# Patient Record
Sex: Male | Born: 1945 | Race: Black or African American | Hispanic: No | Smoking: Never smoker
Health system: Southern US, Community
[De-identification: ages and names within clinical notes are randomized; demographics above are authoritative.]

## PROBLEM LIST (undated history)

## (undated) ENCOUNTER — Emergency Department (HOSPITAL_COMMUNITY): Payer: Non-veteran care | Source: Home / Self Care

## (undated) DIAGNOSIS — F29 Unspecified psychosis not due to a substance or known physiological condition: Secondary | ICD-10-CM

## (undated) DIAGNOSIS — F1010 Alcohol abuse, uncomplicated: Secondary | ICD-10-CM

## (undated) DIAGNOSIS — K219 Gastro-esophageal reflux disease without esophagitis: Secondary | ICD-10-CM

## (undated) DIAGNOSIS — F0390 Unspecified dementia without behavioral disturbance: Secondary | ICD-10-CM

## (undated) DIAGNOSIS — I639 Cerebral infarction, unspecified: Secondary | ICD-10-CM

---

## 2011-03-21 ENCOUNTER — Emergency Department (HOSPITAL_COMMUNITY): Payer: Self-pay

## 2011-03-21 ENCOUNTER — Encounter (HOSPITAL_COMMUNITY): Payer: Self-pay

## 2011-03-21 ENCOUNTER — Emergency Department (HOSPITAL_COMMUNITY)
Admission: EM | Admit: 2011-03-21 | Discharge: 2011-03-21 | Disposition: A | Discharge status: Home / Self Care | Payer: Self-pay | Attending: Emergency Medicine | Admitting: Emergency Medicine

## 2011-03-21 DIAGNOSIS — M5030 Other cervical disc degeneration, unspecified cervical region: Secondary | ICD-10-CM | POA: Insufficient documentation

## 2011-03-21 DIAGNOSIS — F29 Unspecified psychosis not due to a substance or known physiological condition: Secondary | ICD-10-CM | POA: Insufficient documentation

## 2011-03-21 DIAGNOSIS — W06XXXA Fall from bed, initial encounter: Secondary | ICD-10-CM | POA: Insufficient documentation

## 2011-03-21 DIAGNOSIS — M25559 Pain in unspecified hip: Secondary | ICD-10-CM | POA: Insufficient documentation

## 2011-03-21 DIAGNOSIS — G319 Degenerative disease of nervous system, unspecified: Secondary | ICD-10-CM | POA: Insufficient documentation

## 2011-03-21 DIAGNOSIS — Y9210 Unspecified residential institution as the place of occurrence of the external cause: Secondary | ICD-10-CM | POA: Insufficient documentation

## 2011-03-21 DIAGNOSIS — T1490XA Injury, unspecified, initial encounter: Secondary | ICD-10-CM | POA: Insufficient documentation

## 2011-07-26 ENCOUNTER — Inpatient Hospital Stay (HOSPITAL_COMMUNITY)
Admission: EM | Admit: 2011-07-26 | Discharge: 2011-08-01 | DRG: 689 | Disposition: A | Discharge status: Skilled Nursing Facility | Payer: Non-veteran care | Attending: Internal Medicine | Admitting: Internal Medicine

## 2011-07-26 ENCOUNTER — Emergency Department (HOSPITAL_COMMUNITY): Payer: Non-veteran care

## 2011-07-26 DIAGNOSIS — J9819 Other pulmonary collapse: Secondary | ICD-10-CM | POA: Diagnosis present

## 2011-07-26 DIAGNOSIS — K922 Gastrointestinal hemorrhage, unspecified: Secondary | ICD-10-CM | POA: Diagnosis present

## 2011-07-26 DIAGNOSIS — J189 Pneumonia, unspecified organism: Secondary | ICD-10-CM | POA: Diagnosis present

## 2011-07-26 DIAGNOSIS — B958 Unspecified staphylococcus as the cause of diseases classified elsewhere: Secondary | ICD-10-CM | POA: Diagnosis present

## 2011-07-26 DIAGNOSIS — M5030 Other cervical disc degeneration, unspecified cervical region: Secondary | ICD-10-CM | POA: Diagnosis present

## 2011-07-26 DIAGNOSIS — Z8673 Personal history of transient ischemic attack (TIA), and cerebral infarction without residual deficits: Secondary | ICD-10-CM

## 2011-07-26 DIAGNOSIS — G92 Toxic encephalopathy: Secondary | ICD-10-CM | POA: Diagnosis present

## 2011-07-26 DIAGNOSIS — R1310 Dysphagia, unspecified: Secondary | ICD-10-CM | POA: Diagnosis present

## 2011-07-26 DIAGNOSIS — N390 Urinary tract infection, site not specified: Principal | ICD-10-CM | POA: Diagnosis present

## 2011-07-26 DIAGNOSIS — E875 Hyperkalemia: Secondary | ICD-10-CM | POA: Diagnosis present

## 2011-07-26 DIAGNOSIS — D649 Anemia, unspecified: Secondary | ICD-10-CM | POA: Diagnosis present

## 2011-07-26 DIAGNOSIS — G929 Unspecified toxic encephalopathy: Secondary | ICD-10-CM | POA: Diagnosis present

## 2011-07-26 DIAGNOSIS — F17200 Nicotine dependence, unspecified, uncomplicated: Secondary | ICD-10-CM | POA: Diagnosis present

## 2011-07-26 DIAGNOSIS — R4182 Altered mental status, unspecified: Secondary | ICD-10-CM | POA: Diagnosis present

## 2011-07-26 HISTORY — DX: Unspecified psychosis not due to a substance or known physiological condition: F29

## 2011-07-26 HISTORY — DX: Gastro-esophageal reflux disease without esophagitis: K21.9

## 2011-07-26 HISTORY — DX: Alcohol abuse, uncomplicated: F10.10

## 2011-07-26 HISTORY — DX: Cerebral infarction, unspecified: I63.9

## 2011-07-26 LAB — AMMONIA: Ammonia: 26 umol/L (ref 11–60)

## 2011-07-26 LAB — URINALYSIS, ROUTINE W REFLEX MICROSCOPIC
Bilirubin Urine: NEGATIVE
Nitrite: POSITIVE — AB
Protein, ur: NEGATIVE mg/dL
Specific Gravity, Urine: 1.026 (ref 1.005–1.030)
Urobilinogen, UA: 0.2 mg/dL (ref 0.0–1.0)

## 2011-07-26 LAB — URINE MICROSCOPIC-ADD ON

## 2011-07-26 LAB — CBC
HCT: 34.9 % — ABNORMAL LOW (ref 39.0–52.0)
Hemoglobin: 12.2 g/dL — ABNORMAL LOW (ref 13.0–17.0)
MCH: 31.6 pg (ref 26.0–34.0)
MCHC: 35 g/dL (ref 30.0–36.0)

## 2011-07-26 LAB — DIFFERENTIAL
Lymphocytes Relative: 32 % (ref 12–46)
Lymphs Abs: 2 10*3/uL (ref 0.7–4.0)
Monocytes Absolute: 0.5 10*3/uL (ref 0.1–1.0)
Monocytes Relative: 9 % (ref 3–12)
Neutro Abs: 3.1 10*3/uL (ref 1.7–7.7)

## 2011-07-26 LAB — COMPREHENSIVE METABOLIC PANEL
Alkaline Phosphatase: 188 U/L — ABNORMAL HIGH (ref 39–117)
BUN: 24 mg/dL — ABNORMAL HIGH (ref 6–23)
CO2: 29 mEq/L (ref 19–32)
Calcium: 10.4 mg/dL (ref 8.4–10.5)
GFR calc Af Amer: >60 mL/min (ref >60)
GFR calc non Af Amer: >60 mL/min (ref >60)
Glucose, Bld: 73 mg/dL (ref 70–99)
Total Protein: 7.9 g/dL (ref 6.0–8.3)

## 2011-07-27 ENCOUNTER — Inpatient Hospital Stay (HOSPITAL_COMMUNITY): Payer: Non-veteran care

## 2011-07-27 ENCOUNTER — Encounter (HOSPITAL_COMMUNITY): Payer: Self-pay | Admitting: Radiology

## 2011-07-27 LAB — PROTIME-INR: INR: 1.09 (ref 0.00–1.49)

## 2011-07-27 LAB — CBC
HCT: 31.6 % — ABNORMAL LOW (ref 39.0–52.0)
Hemoglobin: 10.8 g/dL — ABNORMAL LOW (ref 13.0–17.0)
MCV: 91.3 fL (ref 78.0–100.0)
RDW: 13.4 % (ref 11.5–15.5)
WBC: 5.5 10*3/uL (ref 4.0–10.5)

## 2011-07-27 LAB — FERRITIN: Ferritin: 122 ng/mL (ref 22–322)

## 2011-07-27 LAB — BASIC METABOLIC PANEL
CO2: 31 mEq/L (ref 19–32)
Chloride: 103 mEq/L (ref 96–112)
GFR calc Af Amer: >60 mL/min (ref >60)
Potassium: 4.1 mEq/L (ref 3.5–5.1)
Sodium: 140 mEq/L (ref 135–145)

## 2011-07-27 LAB — APTT: aPTT: 30 seconds (ref 24–37)

## 2011-07-27 LAB — GLUCOSE, CAPILLARY
Glucose-Capillary: 106 mg/dL — ABNORMAL HIGH (ref 70–99)
Glucose-Capillary: 97 mg/dL (ref 70–99)
Glucose-Capillary: 102 mg/dL — ABNORMAL HIGH (ref 70–99)

## 2011-07-27 LAB — IRON AND TIBC
Iron: 104 ug/dL (ref 42–135)
Saturation Ratios: 38 % (ref 20–55)
TIBC: 271 ug/dL (ref 215–435)
UIBC: 167 ug/dL

## 2011-07-27 MED ORDER — IOHEXOL 300 MG/ML  SOLN
50.0000 mL | Freq: Once | INTRAMUSCULAR | Status: AC | PRN
  Administered 2011-07-27: 15 mL via INTRAVENOUS

## 2011-07-28 ENCOUNTER — Inpatient Hospital Stay (HOSPITAL_COMMUNITY): Payer: Non-veteran care

## 2011-07-28 DIAGNOSIS — F29 Unspecified psychosis not due to a substance or known physiological condition: Secondary | ICD-10-CM

## 2011-07-28 LAB — BASIC METABOLIC PANEL
BUN: 8 mg/dL (ref 6–23)
CO2: 28 mEq/L (ref 19–32)
Calcium: 9.6 mg/dL (ref 8.4–10.5)
Creatinine, Ser: 0.47 mg/dL — ABNORMAL LOW (ref 0.50–1.35)
Creatinine, Ser: <0.47 mg/dL — ABNORMAL LOW (ref 0.50–1.35)
GFR calc Af Amer: >60 mL/min (ref >60)
GFR calc non Af Amer: >60 mL/min (ref >60)
Glucose, Bld: 95 mg/dL (ref 70–99)
Sodium: 139 mEq/L (ref 135–145)

## 2011-07-28 LAB — CBC
HCT: 31.1 % — ABNORMAL LOW (ref 39.0–52.0)
Hemoglobin: 11.1 g/dL — ABNORMAL LOW (ref 13.0–17.0)
MCH: 31.5 pg (ref 26.0–34.0)
MCH: 32.1 pg (ref 26.0–34.0)
MCHC: 35.1 g/dL (ref 30.0–36.0)
MCHC: 35.7 g/dL (ref 30.0–36.0)
MCV: 89.6 fL (ref 78.0–100.0)
MCV: 89.9 fL (ref 78.0–100.0)
Platelets: 288 10*3/uL (ref 150–400)
RBC: 3.56 MIL/uL — ABNORMAL LOW (ref 4.22–5.81)
RDW: 13 % (ref 11.5–15.5)
RDW: 13.2 % (ref 11.5–15.5)

## 2011-07-28 LAB — GLUCOSE, CAPILLARY
Glucose-Capillary: 90 mg/dL (ref 70–99)
Glucose-Capillary: 100 mg/dL — ABNORMAL HIGH (ref 70–99)
Glucose-Capillary: 94 mg/dL (ref 70–99)

## 2011-07-28 LAB — URINE CULTURE: Culture  Setup Time: 201208012055

## 2011-07-29 LAB — VANCOMYCIN, TROUGH: Vancomycin Tr: 19.5 ug/mL (ref 10.0–20.0)

## 2011-07-30 ENCOUNTER — Inpatient Hospital Stay (HOSPITAL_COMMUNITY): Payer: Non-veteran care

## 2011-07-30 DIAGNOSIS — F29 Unspecified psychosis not due to a substance or known physiological condition: Secondary | ICD-10-CM

## 2011-07-30 LAB — CBC
HCT: 27.7 % — ABNORMAL LOW (ref 39.0–52.0)
MCH: 31.7 pg (ref 26.0–34.0)
MCV: 88.5 fL (ref 78.0–100.0)
MCV: 87.8 fL (ref 78.0–100.0)
Platelets: 264 10*3/uL (ref 150–400)
Platelets: 275 10*3/uL (ref 150–400)
RBC: 3.13 MIL/uL — ABNORMAL LOW (ref 4.22–5.81)
RDW: 13.1 % (ref 11.5–15.5)
RDW: 13 % (ref 11.5–15.5)
WBC: 6.7 10*3/uL (ref 4.0–10.5)

## 2011-07-31 LAB — CBC
HCT: 28.2 % — ABNORMAL LOW (ref 39.0–52.0)
Hemoglobin: 10.1 g/dL — ABNORMAL LOW (ref 13.0–17.0)
Hemoglobin: 9.7 g/dL — ABNORMAL LOW (ref 13.0–17.0)
MCH: 31.1 pg (ref 26.0–34.0)
MCH: 31.8 pg (ref 26.0–34.0)
MCHC: 36.2 g/dL — ABNORMAL HIGH (ref 30.0–36.0)
MCHC: 35.3 g/dL (ref 30.0–36.0)
MCHC: 36.2 g/dL — ABNORMAL HIGH (ref 30.0–36.0)
MCHC: 36.3 g/dL — ABNORMAL HIGH (ref 30.0–36.0)
MCV: 88 fL (ref 78.0–100.0)
MCV: 87.6 fL (ref 78.0–100.0)
Platelets: 281 10*3/uL (ref 150–400)
Platelets: 285 10*3/uL (ref 150–400)
Platelets: 274 10*3/uL (ref 150–400)
RDW: 13.1 % (ref 11.5–15.5)
RDW: 13.2 % (ref 11.5–15.5)
RDW: 13.2 % (ref 11.5–15.5)
WBC: 5.1 10*3/uL (ref 4.0–10.5)
WBC: 5.6 10*3/uL (ref 4.0–10.5)

## 2011-08-01 LAB — CROSSMATCH
Antibody Screen: NEGATIVE
Unit division: 0

## 2011-08-01 LAB — CBC
HCT: 28.1 % — ABNORMAL LOW (ref 39.0–52.0)
Hemoglobin: 10 g/dL — ABNORMAL LOW (ref 13.0–17.0)
MCHC: 35.6 g/dL (ref 30.0–36.0)
MCV: 87.5 fL (ref 78.0–100.0)
Platelets: 264 10*3/uL (ref 150–400)
RBC: 3.03 MIL/uL — ABNORMAL LOW (ref 4.22–5.81)
RBC: 3.19 MIL/uL — ABNORMAL LOW (ref 4.22–5.81)
RDW: 13.4 % (ref 11.5–15.5)
WBC: 5.6 10*3/uL (ref 4.0–10.5)
WBC: 5.9 10*3/uL (ref 4.0–10.5)

## 2011-08-03 LAB — CULTURE, BLOOD (SINGLE): Culture  Setup Time: 201208032146

## 2011-08-04 NOTE — Consult Note (Signed)
[name] [name], SHEAHAN NO.:  [id number]  MEDICAL RECORD NO.:  [id number]  LOCATION:  4503                         FACILITY:  [facility]  PHYSICIAN:  Willis Modena, MD     DATE OF BIRTH:  04-22-1946  DATE OF CONSULTATION:  07/31/2011 DATE OF DISCHARGE:                                CONSULTATION   REASON FOR CONSULTATION:  Blood in stool.  CHIEF COMPLAINT:  Blood in stool.  HISTORY OF PRESENT ILLNESS:  [name] [name] is a 65 year old gentleman who lives at a nursing home.  He was admitted several days ago for altered mental status.  Per chart, he has a history of polysubstance abuse.  He ultimately had a PEG tube replaced a couple of days ago by Interventional Radiology.  I was called to see [name] [name] because yesterday, he had 2 episodes of bright red blood per rectum.  [name] [name] is still not able to carry on an intelligible conversation.  As a result, no further history is able to be obtained.  He does not have any appreciable abdominal pain and the nurses have not reported any further bleeding for the past 12 hours.  There is also no reported bleeding through his PEG tube.  Past medical history, past surgical history, home medications, allergies, family history, social history, and review of systems, all from dictated note from Dr. Sharon Seller, dated July 26, 2011.  I reviewed and I agree.  PHYSICAL EXAMINATION:  VITAL SIGNS:  Blood pressure 102/57, heart rate 67, respiratory rate 20, temperature 97.1, oxygen saturation 97% on room air. GENERAL:  [name] [name] is awake, a bit combative, able to speak, but is not able to carry on any appreciable conversation. HEENT:  Poor dentition.  No oropharyngeal lesions.  Eyes, sclerae anicteric.  Conjunctivae pink. NECK:  Supple. LUNGS:  Clear. HEART:  Regular. ABDOMEN:  Soft.  PEG site looks clean, dry, and intact.  No blood or discharge from this area.  Normoactive bowel sounds.  No liver or splenic enlargement.  No bulging  flanks to suggest ascites. EXTREMITIES:  No peripheral cyanosis, clubbing, or edema. NEUROLOGIC:  As I mentioned, he is combative, cannot really answer questions intelligibly or appropriately. LYMPHATICS:  No papillary, axillary, submandibular, or supraclavicular adenopathy. SKIN:  No obvious rash or ecchymoses. RECTAL:  Showed normal anal sphincter tone, brown stool.  No evidence of blood.  LABORATORY STUDIES:  Hemoglobin on his arrival is 12.2, currently 10.0, his hemoglobin has remained about the same for the past 2 days.  RADIOLOGIC STUDIES:  As I mentioned, he had a replacement of his gastrostomy tube 2 days ago.  He also had a Speech Therapy consultation which recommended that he not consume any food products by mouth.  IMPRESSION:  [name] [name] is a 65 year old gentleman, admitted with altered mental status.  He had a PEG replacement a couple of days ago.  He is not able to provide any appreciable history, but by nursing reports, he had couple episodes of bright red blood per rectum yesterday.  He has not had any further bleeding today.  Source of this bleeding is unclear, but sounds most consistent with a hemorrhoidal or other anorectal process.  Certainly, multiple other differential considerations cannot be completely excluded.  He is hemodynamically stable.  PLAN: 1. Agree with supportive management with feeds through this PEG tube     as well as serial CBCs. 2. Typically one would recommend a colonoscopy or at least a     sigmoidoscopy in this situation.  However, given the patient's     altered mentation as well as his exquisitely poor functional status     at this point, I would defer any endoscopic type of evaluation     unless or until he has further ongoing bleeding.  Certainly, we     need to talk to his healthcare power of attorney before pursuing     those avenues.  Hopefully, we will not come to that.  Thanks again for allowing me to participate in [name] [name]  care.     Willis Modena, MD     WO/MEDQ  D:  07/31/2011  T:  08/01/2011  Job:  [id number]  Electronically Signed by Willis Modena  on 08/04/2011 82:95:62 PM

## 2011-09-13 ENCOUNTER — Ambulatory Visit (HOSPITAL_COMMUNITY)
Admission: RE | Admit: 2011-09-13 | Discharge: 2011-09-13 | Disposition: A | Discharge status: Home / Self Care | Payer: Non-veteran care | Source: Ambulatory Visit | Attending: Internal Medicine | Admitting: Internal Medicine

## 2011-09-13 ENCOUNTER — Other Ambulatory Visit (HOSPITAL_BASED_OUTPATIENT_CLINIC_OR_DEPARTMENT_OTHER): Payer: Self-pay | Admitting: Internal Medicine

## 2011-09-13 DIAGNOSIS — Z431 Encounter for attention to gastrostomy: Secondary | ICD-10-CM | POA: Insufficient documentation

## 2011-09-13 DIAGNOSIS — F0390 Unspecified dementia without behavioral disturbance: Secondary | ICD-10-CM | POA: Insufficient documentation

## 2011-09-13 MED ORDER — IOHEXOL 300 MG/ML  SOLN
50.0000 mL | Freq: Once | INTRAMUSCULAR | Status: AC | PRN
  Administered 2011-09-13: 10 mL via INTRAVENOUS
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
## 2011-09-21 ENCOUNTER — Emergency Department (HOSPITAL_COMMUNITY): Payer: Non-veteran care

## 2011-09-21 ENCOUNTER — Observation Stay (HOSPITAL_COMMUNITY)
Admission: EM | Admit: 2011-09-21 | Discharge: 2011-09-25 | Disposition: A | Discharge status: Skilled Nursing Facility | Payer: Non-veteran care | Attending: Internal Medicine | Admitting: Internal Medicine

## 2011-09-21 DIAGNOSIS — F29 Unspecified psychosis not due to a substance or known physiological condition: Secondary | ICD-10-CM | POA: Insufficient documentation

## 2011-09-21 DIAGNOSIS — Z86718 Personal history of other venous thrombosis and embolism: Secondary | ICD-10-CM | POA: Insufficient documentation

## 2011-09-21 DIAGNOSIS — Z79899 Other long term (current) drug therapy: Secondary | ICD-10-CM | POA: Insufficient documentation

## 2011-09-21 DIAGNOSIS — R1319 Other dysphagia: Secondary | ICD-10-CM | POA: Insufficient documentation

## 2011-09-21 DIAGNOSIS — G40909 Epilepsy, unspecified, not intractable, without status epilepticus: Secondary | ICD-10-CM | POA: Insufficient documentation

## 2011-09-21 DIAGNOSIS — M7989 Other specified soft tissue disorders: Secondary | ICD-10-CM

## 2011-09-21 DIAGNOSIS — Z7982 Long term (current) use of aspirin: Secondary | ICD-10-CM | POA: Insufficient documentation

## 2011-09-21 DIAGNOSIS — R599 Enlarged lymph nodes, unspecified: Secondary | ICD-10-CM | POA: Insufficient documentation

## 2011-09-21 DIAGNOSIS — Z8673 Personal history of transient ischemic attack (TIA), and cerebral infarction without residual deficits: Secondary | ICD-10-CM | POA: Insufficient documentation

## 2011-09-21 DIAGNOSIS — R079 Chest pain, unspecified: Principal | ICD-10-CM | POA: Insufficient documentation

## 2011-09-21 LAB — DIFFERENTIAL
Basophils Relative: 0 % (ref 0–1)
Lymphocytes Relative: 23 % (ref 12–46)
Monocytes Absolute: 0.6 10*3/uL (ref 0.1–1.0)
Monocytes Relative: 7 % (ref 3–12)
Neutro Abs: 6.2 10*3/uL (ref 1.7–7.7)
Neutrophils Relative %: 66 % (ref 43–77)

## 2011-09-21 LAB — URINALYSIS, ROUTINE W REFLEX MICROSCOPIC
Glucose, UA: NEGATIVE mg/dL
Leukocytes, UA: NEGATIVE
Nitrite: NEGATIVE
Specific Gravity, Urine: 1.023 (ref 1.005–1.030)
pH: 6 (ref 5.0–8.0)

## 2011-09-21 LAB — CK TOTAL AND CKMB (NOT AT ARMC)
CK, MB: 2.6 ng/mL (ref 0.3–4.0)
Relative Index: INVALID (ref 0.0–2.5)
Total CK: 79 U/L (ref 7–232)

## 2011-09-21 LAB — PROTIME-INR: INR: 1 (ref 0.00–1.49)

## 2011-09-21 LAB — CBC
HCT: 30.9 % — ABNORMAL LOW (ref 39.0–52.0)
Hemoglobin: 10.8 g/dL — ABNORMAL LOW (ref 13.0–17.0)
MCH: 31.7 pg (ref 26.0–34.0)
MCHC: 35 g/dL (ref 30.0–36.0)
RBC: 3.41 MIL/uL — ABNORMAL LOW (ref 4.22–5.81)

## 2011-09-21 LAB — POCT I-STAT TROPONIN I

## 2011-09-21 LAB — LIPID PANEL: Cholesterol: 134 mg/dL (ref 0–200)

## 2011-09-21 LAB — POCT I-STAT, CHEM 8
BUN: 28 mg/dL — ABNORMAL HIGH (ref 6–23)
Creatinine, Ser: 1 mg/dL (ref 0.50–1.35)
Glucose, Bld: 94 mg/dL (ref 70–99)
Potassium: 4.1 mEq/L (ref 3.5–5.1)
Sodium: 137 mEq/L (ref 135–145)
TCO2: 27 mmol/L (ref 0–100)

## 2011-09-21 LAB — APTT: aPTT: 30 seconds (ref 24–37)

## 2011-09-21 LAB — TROPONIN I: Troponin I: <0.3 ng/mL (ref <0.30)

## 2011-09-21 LAB — D-DIMER, QUANTITATIVE: D-Dimer, Quant: 1.25 ug/mL-FEU — ABNORMAL HIGH (ref 0.00–0.48)

## 2011-09-21 MED ORDER — IOHEXOL 350 MG/ML SOLN
80.0000 mL | Freq: Once | INTRAVENOUS | Status: AC | PRN
  Administered 2011-09-21: 80 mL via INTRAVENOUS
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
## 2011-09-22 DIAGNOSIS — I517 Cardiomegaly: Secondary | ICD-10-CM

## 2011-09-23 LAB — CBC
MCH: 32 pg (ref 26.0–34.0)
Platelets: 257 10*3/uL (ref 150–400)
RBC: 3.53 MIL/uL — ABNORMAL LOW (ref 4.22–5.81)
WBC: 6.3 10*3/uL (ref 4.0–10.5)

## 2011-09-23 LAB — GLUCOSE, CAPILLARY: Glucose-Capillary: 142 mg/dL — ABNORMAL HIGH (ref 70–99)

## 2011-09-23 LAB — BASIC METABOLIC PANEL
CO2: 31 mEq/L (ref 19–32)
Calcium: 9.8 mg/dL (ref 8.4–10.5)
Creatinine, Ser: 0.53 mg/dL (ref 0.50–1.35)
GFR calc Af Amer: >60 mL/min (ref >60)

## 2011-09-23 LAB — URINE CULTURE: Colony Count: 30000

## 2011-09-24 LAB — GLUCOSE, CAPILLARY: Glucose-Capillary: 159 mg/dL — ABNORMAL HIGH (ref 70–99)

## 2011-09-25 NOTE — H&P (Signed)
[name] [name], HEETER NO.:  [id number]  MEDICAL RECORD NO.:  [id number]  LOCATION:  [facility]                         FACILITY:  [facility]  PHYSICIAN:  Osvaldo Shipper, MD     DATE OF BIRTH:  [birth year]  DATE OF ADMISSION:  09/21/2011 DATE OF DISCHARGE:                             HISTORY & PHYSICAL   PRIMARY CARE PHYSICIAN:  Medical laboratory scientific officer.  The patient resides in [facility] in the nursing facility.  ADMISSION DIAGNOSES: 1. Chest pain, etiology unclear. 2. History of seizure disorder. 3. History of stroke with confusion at baseline. 4. History of psychosis. 5. Dysphagia, requiring PEG feeds. 6. Venous thromboembolism in the past.  CHIEF COMPLAINT:  Chest pain.  HISTORY OF PRESENT ILLNESS:  The patient is a 65 year old African American male with past medical history of stroke, psychosis, seizure disorder who resides in a nursing facility.  According to reports obtained from the nursing home, the patient was in his usual state of health till about 3 o'clock in the morning when he started complaining of chest pain.  Apparently this was after he was given his bolus feeding.  They checked his blood pressure and it was borderline low at 99 systolic.  So, they decided to send him to the hospital.  The patient has history of psychosis.  He has history of stroke, has encephalomalacia and possibly has cognitive impairment as a result of the same and so history is very limited.  He tells me the pain was 10/10 in intensity but currently the pain has eased up but then after a while he says he has got light pain.  He says he felt short of breath.  He had some nausea without any emesis.  Denies any palpitations.  There is no history of cough, fever, or chills.  The pain did not radiate anywhere. There were no precipitating, aggravating or relieving factors.  Once again because of his alteration in mental status, history is  very limited.  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  MEDICATIONS:  Medications at the nursing facility includes the following: 1. Folic acid 1 mg daily. 2. Prevacid 30 mg daily. 3. Centrum liquid 5 mL every morning. 4. Thiamine 100 mg daily. 5. Aspirin 81 mg daily. 6. Keppra 500 mg q.12 h. 7. Metoprolol 25 mg b.i.d. 8. Seroquel 200 mg twice daily. 9. Ativan 1 mg every 6 hours as needed for agitation orally. 10.Tylenol 650 mg every 6 hours as needed. 11.Ativan 1 mg intramuscularly every 6 hours as needed for seizure     activity. 12.Ativan gel 1 mg topically to wrist every 6 hours as needed for     agitation. 13.Magnesium oxide 400 mg every 6 hours. 14.Resource 2.0 bolus feeding 8 ounces 6 times daily. 15.Water flushes 100 mL before and after feeds and medications.  ALLERGIES:  There is allergy listed to MONOPRIL and PRAZOSIN.  The type of reaction is unknown.  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  Once again history is limited.  Based on the nursing home records there appears to be a history of venous thromboembolism in the past and it appears that he was on anticoagulation in the past.  SOCIAL HISTORY:  Lives in a nursing home.  Rest of the social history is unobtainable at this time.  FAMILY HISTORY:  Unobtainable because of the patient's encephalopathy.  REVIEW OF SYSTEMS:  Unable to do because of the patient's mental status.  PHYSICAL EXAMINATION:  VITAL SIGNS:  Temperature 97.7, blood pressure 129/79, heart rate 83, respiratory rate is 17, saturation 100% on room air. GENERAL:  He is a thin African American male in no distress. HEENT:  Head is normocephalic, atraumatic.  Pupils are equal reacting. No pallor.  No icterus.  Oral mucous membrane  is slightly dry.  Dental caries is noted. NECK:  Soft and supple.  No thyromegaly appreciated.  No cervical, supraclavicular, inguinal lymphadenopathy present. LUNGS:  Clear to auscultation bilaterally with no wheezing, rales or rhonchi. CARDIOVASCULAR:  S1, S2 is normal regular.  No S3, S4, rubs, murmurs or bruits.  No pedal edema. ABDOMEN:  Soft, nontender, nondistended.  Bowel sounds are present.  No mass or organomegaly appreciated.  PEG tube appears to be in good position.  No erythema is noted around the site. GU: External examination was unremarkable. NEUROLOGIC:  He is alert, oriented to himself but otherwise confused, which is his baseline.  Neurologically, he is able to move all his extremities but does not seem to have adequate strength. SKIN:  Does not reveal any rashes.  LABORATORY DATA:  His white cell count is normal, hemoglobin is 10.8, MCV is 90, platelet count is 256.  Coags are normal.  Electrolytes are normal.  BUN is 28, creatinine is 1.0, troponin 0.01.  Urine did not show any infection.  IMAGING STUDIES:  His chest x-ray which revealed increased density of the left hemithorax, may reflect positioning or possibly mild atelectasis, otherwise grossly clear.  EKG was done which shows sinus rhythm with normal axis.  Intervals appear to be in the normal range.  Rate is 78.  No Q-waves.  No concerning ST or T-wave changes.  There is some T flattening in the lateral leads.  No older EKG available for comparison.  QT corrected is 497.  Apparently his guardianship belongs to [facility].  ASSESSMENT:  This is a 65 year old African American male sent from a nursing home floor, chest pain.  Etiology of this is unclear. Considering his history of her venous thromboembolism that needs to be in the differential.  Acute coronary syndrome is in the differential, although less likely.  GERD, causes such symptoms as well. 1. Chest pain.  We  will go ahead and check a D-dimer to begin with.     If that is elevated.  He will need to have a CT scan of his chest.     We will also in the meantime, rule him out for acute coronary     syndrome.  EKG will be repeated.  Aspirin will be provided.  Lipid     panel will be checked. 2. History of seizure disorder.  Continue with Keppra. 3. History of anemia.  His hemoglobin is stable.  Anemia panel was     checked back in August and no iron deficiency was seen. 4. History of encephalopathy and psychosis.  We will use Ativan as     needed. 5. DVT prophylaxis will be initiated.  6. Further management decisions will depend on results of further     testing and patient's response to treatment.     Osvaldo Shipper, MD     GK/MEDQ  D:  09/21/2011  T:  09/21/2011  Job:  [id number]  cc:   [facility]  Electronically Signed by Osvaldo Shipper MD on 09/25/2011 04:54:09 PM

## 2011-09-27 NOTE — Discharge Summary (Signed)
  [name], [name] NO.:  [id number]  MEDICAL RECORD NO.:  [id number]  LOCATION:  4736                         FACILITY:  [facility]  PHYSICIAN:  Thad Ranger, MD       DATE OF BIRTH:  15-Jan-1946  DATE OF ADMISSION:  09/21/2011 DATE OF DISCHARGE:                              DISCHARGE SUMMARY   ADDENDUM  PRIMARY CARE PHYSICIAN:  Medical laboratory scientific officer.  FINAL DISCHARGE DIAGNOSES: 1. Atypical chest pain, acute coronary syndrome ruled out, resolved. 2. Seizure disorder. 3. History of cerebrovascular accident with confusion. 4. History of psychosis. 5. History of venous embolism. 6. History of dysphagia requiring PEG tube.  FINAL DISCHARGE MEDICATIONS: 1. Osmolyte 237 mL via tube 5 times daily. 2. Aspirin 81 mg via tube daily. 3. Ativan 1 mL topically every 6 hours as needed to wrist for     agitation. 4. Ativan 0.5 mL in tube every 6 hours as needed for agitation. 5. Ativan 0.5 mL intramuscularly every 6 hours as needed for seizures. 6. Centrum liquid antioxidant 5 mL in tube every morning. 7. Folic acid 1 tablet via tube daily. 8. Keppra oral solution 100 mg/mL, 5 mL via tube twice daily. 9. Lansoprazole 30 mg via tube daily. 10.Mag ox 400 mg via tube every 6 hours. 11.Metoprolol 25 mg via tube twice daily. 12.Seroquel 200 mg via tube twice daily. 13.Thiamine 100 mg via tube daily. 14.Tylenol 2 tablets via tube every 6 hours as needed for pain.  Please note that the previously dictated discharge summary by Dr. Mliss Sax on September 23, 2011, still stands.  The patient was awaiting skilled nursing facility placement over the weekend.  DISCHARGE FOLLOWUP:  The patient will follow up with his primary care physician at [facility] within next 7 days for hospital followup.  PHYSICAL EXAMINATION:  VITAL SIGNS:  At the time of the dictation, temperature 98.5, pulse 65, respirations 18, blood pressure 117/73, O2 sats 96% on room air. GENERAL:   The patient is awake, not in any acute distress. HEENT:  Anicteric sclerae and conjunctivae. CVS:  S1 and S2.  Clear.  Regular rate and rhythm. CHEST:  Clear to auscultation bilaterally.  No wheezing, rales, or rhonchi. ABDOMEN:  Soft, nontender, normal bowel sounds.  PEG tube present. EXTREMITIES:  No edema. NEURO:  Nonfocal.  Please refer to the previously dictated discharge summary by Dr. Aldine Contes for the hospital course and imaging and details.  DISCHARGE TIME:  35 minutes.     Thad Ranger, MD     RR/MEDQ  D:  09/25/2011  T:  09/25/2011  Job:  [id number]  cc:   [facility]  Electronically Signed by Andres Labrum RAI  on 09/27/2011 03:15:51 PM

## 2011-09-30 ENCOUNTER — Emergency Department (HOSPITAL_COMMUNITY): Payer: Non-veteran care

## 2011-09-30 ENCOUNTER — Emergency Department (HOSPITAL_COMMUNITY)
Admission: EM | Admit: 2011-09-30 | Discharge: 2011-09-30 | Disposition: A | Discharge status: Home / Self Care | Payer: Non-veteran care | Attending: Emergency Medicine | Admitting: Emergency Medicine

## 2011-09-30 DIAGNOSIS — Z8679 Personal history of other diseases of the circulatory system: Secondary | ICD-10-CM | POA: Insufficient documentation

## 2011-09-30 DIAGNOSIS — K9429 Other complications of gastrostomy: Secondary | ICD-10-CM | POA: Insufficient documentation

## 2011-09-30 DIAGNOSIS — F0390 Unspecified dementia without behavioral disturbance: Secondary | ICD-10-CM | POA: Insufficient documentation

## 2011-09-30 DIAGNOSIS — K219 Gastro-esophageal reflux disease without esophagitis: Secondary | ICD-10-CM | POA: Insufficient documentation

## 2011-12-10 IMAGING — XA IR REPLACE G-TUBE/COLONIC TUBE
1 series · 7 of 7 positions shown · non-contrast
Comparison: 07/30/2011

CLINICAL DATA: Pulled out gastrostomy catheter, dementia

GASTROSTOMY CATHETER REPLACEMENT

[Series 1: run · 7 of 7 slices shown]
[im 1/7]
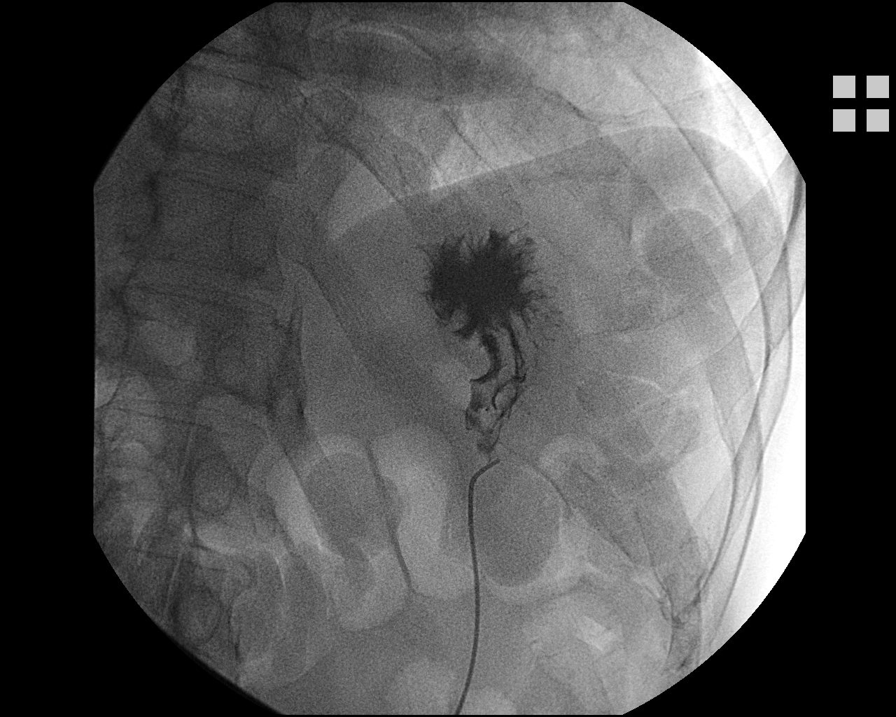
[im 2/7]
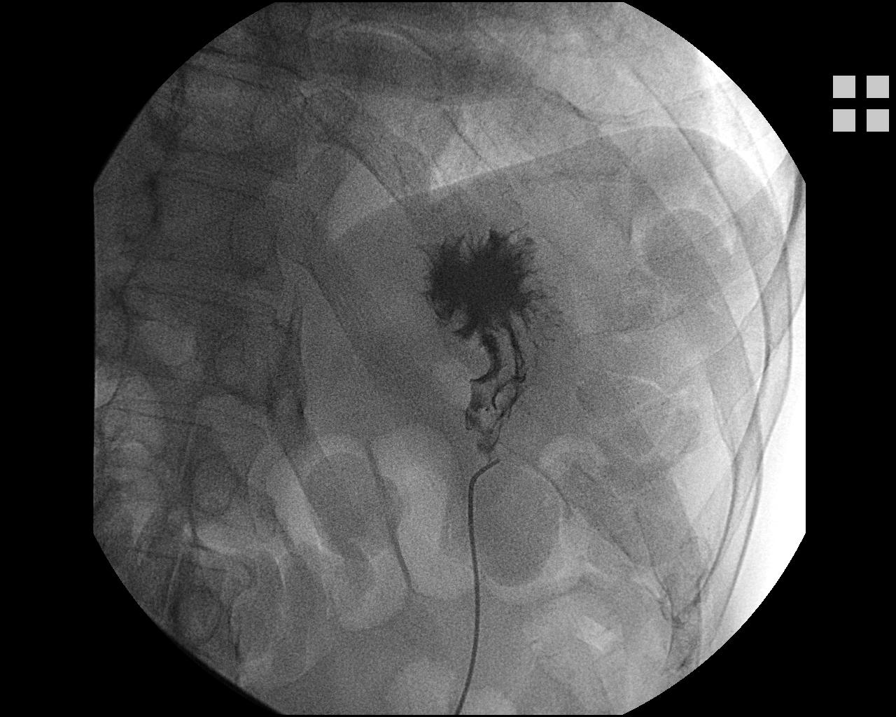
[im 3/7]
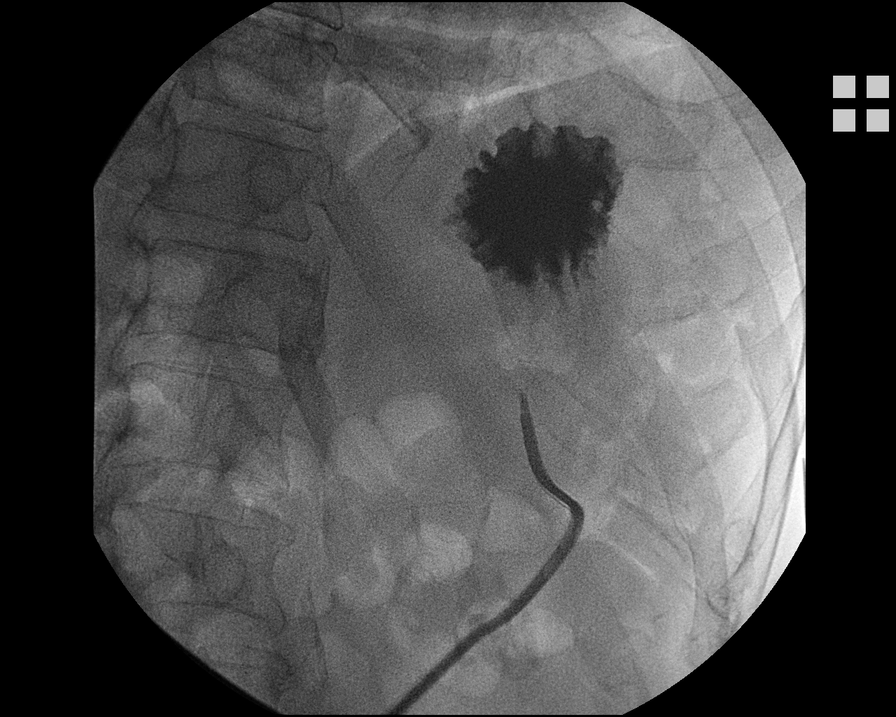
[im 4/7]
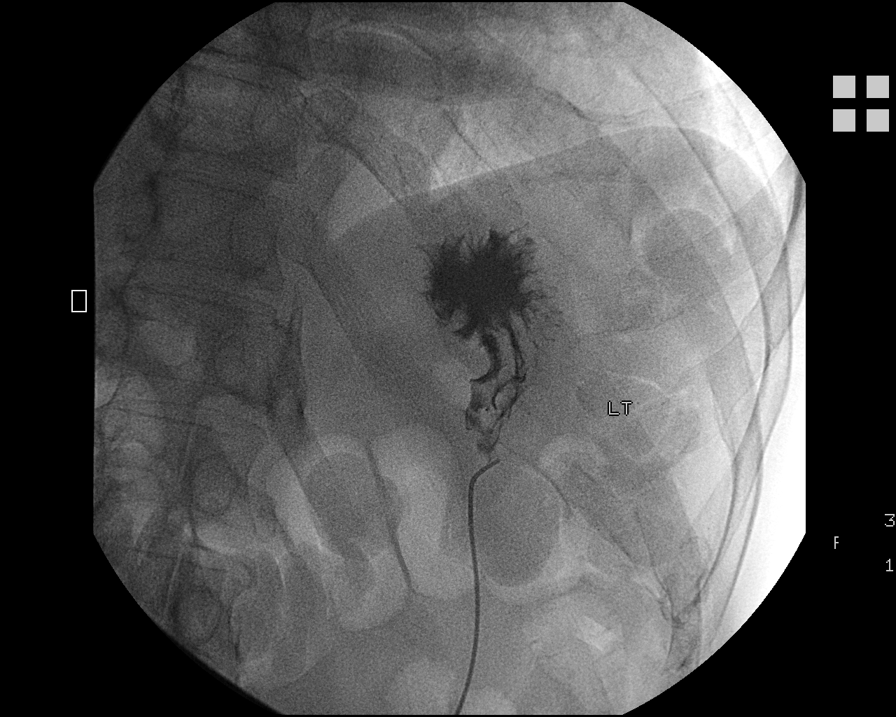
[im 5/7]
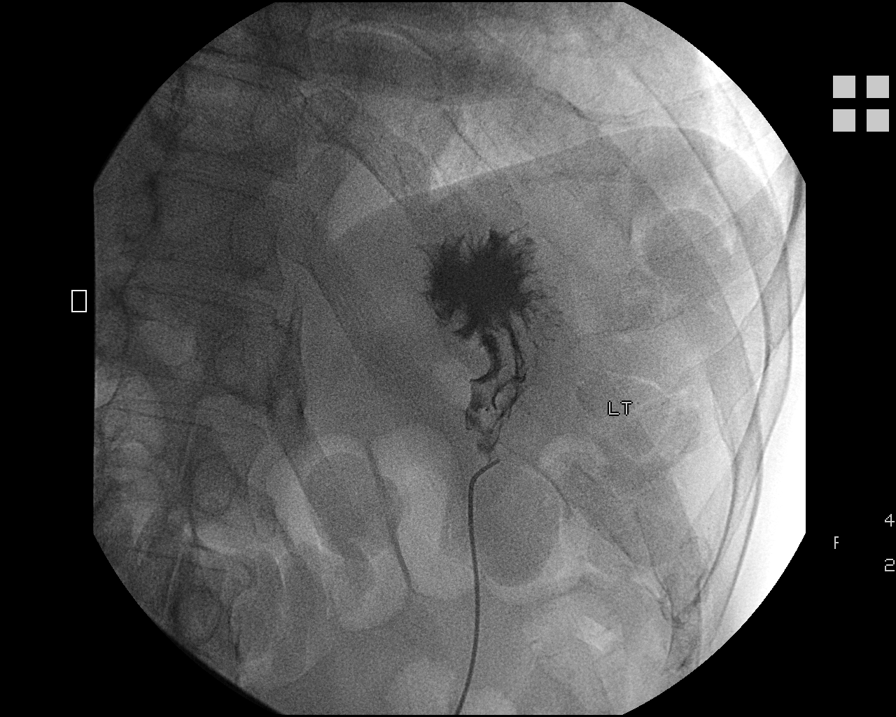
[im 6/7]
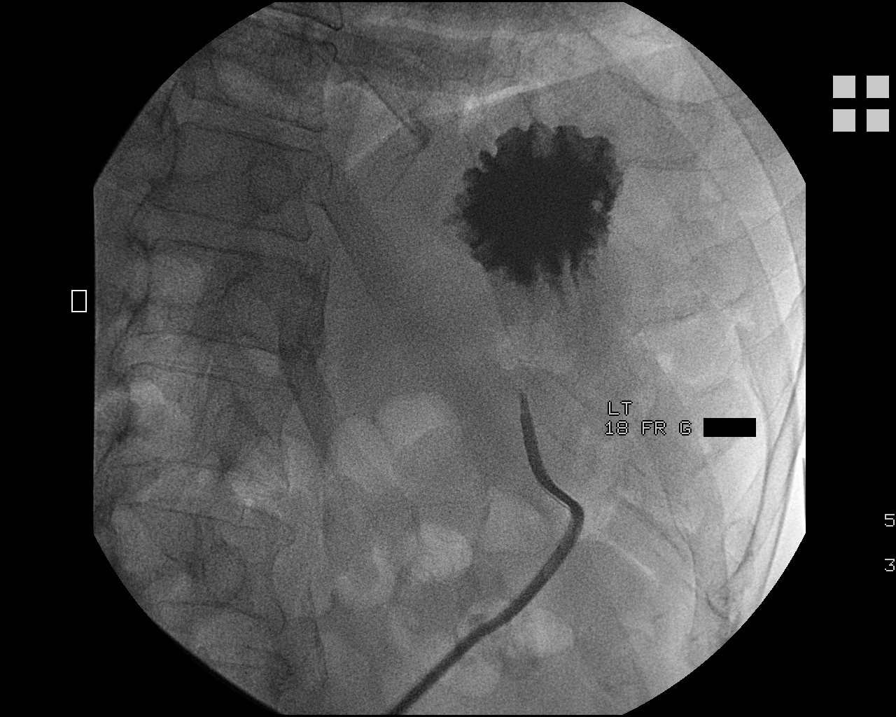
[im 7/7]
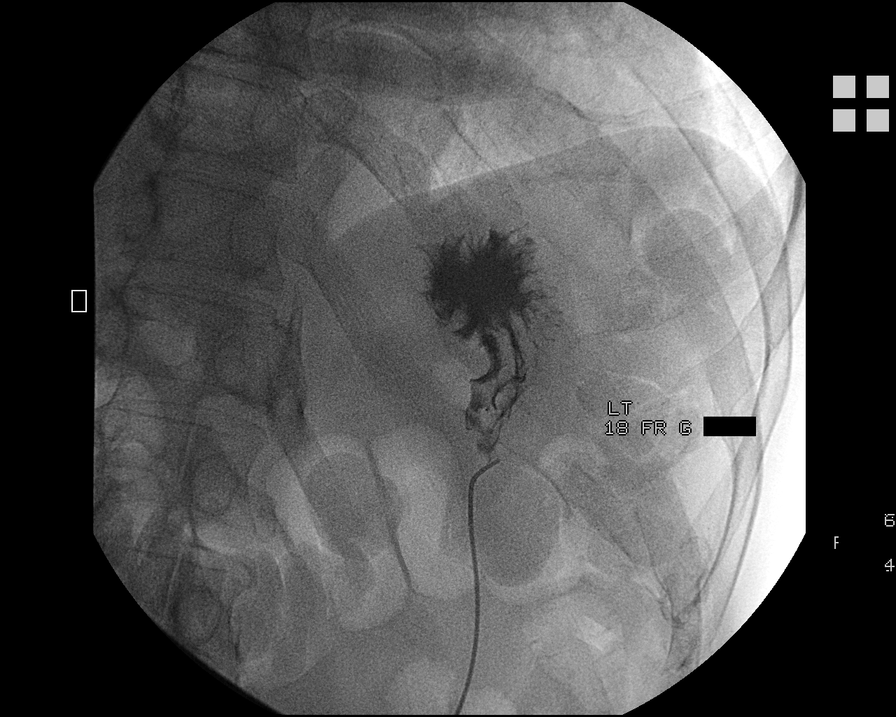

[7 of 7 positions shown; findings below may reference images not displayed]

Technique and findings:  A 5-French Kumpe catheter advanced easily
through the tract, its tip positioned within the gastric lumen
confirmed with contrast injection under fluoroscopy.  The catheter
was exchanged over an Amplatz wire for serial vascular dilators
which facilitated placement of a 18-French balloon retention
gastrostomy catheter.  The balloon was inflated with 10 ml sterile
saline.  Contrast injection confirms appropriate positioning within
the lumen.  The external bumper was applied. The patient tolerated
the procedure well. No immediate complication.
IMPRESSION: 1.  Technically successful replacement   18-French balloon
retention gastrostomy catheter.

## 2012-04-13 ENCOUNTER — Emergency Department (HOSPITAL_COMMUNITY)
Admission: EM | Admit: 2012-04-13 | Discharge: 2012-04-13 | Disposition: A | Discharge status: Skilled Nursing Facility | Payer: Non-veteran care | Attending: Emergency Medicine | Admitting: Emergency Medicine

## 2012-04-13 ENCOUNTER — Emergency Department (HOSPITAL_COMMUNITY): Payer: Non-veteran care

## 2012-04-13 ENCOUNTER — Encounter (HOSPITAL_COMMUNITY): Payer: Self-pay | Admitting: Emergency Medicine

## 2012-04-13 DIAGNOSIS — Z139 Encounter for screening, unspecified: Secondary | ICD-10-CM

## 2012-04-13 DIAGNOSIS — Z931 Gastrostomy status: Secondary | ICD-10-CM | POA: Insufficient documentation

## 2012-04-13 DIAGNOSIS — G40909 Epilepsy, unspecified, not intractable, without status epilepticus: Secondary | ICD-10-CM | POA: Insufficient documentation

## 2012-04-13 DIAGNOSIS — Z8673 Personal history of transient ischemic attack (TIA), and cerebral infarction without residual deficits: Secondary | ICD-10-CM | POA: Insufficient documentation

## 2012-04-13 NOTE — Discharge Instructions (Signed)
Follow up with Dr. Kerry Dory next week as needed.

## 2012-04-13 NOTE — ED Notes (Signed)
Pt brought from the nursing home, [facility], according to the SNF staff, pt pulled out his peg tube, staff put the new one in and at this time need to verify the placement of the same.

## 2012-04-13 NOTE — ED Notes (Signed)
Bed:WA04<BR> Expected date:<BR> Expected time:<BR> Means of arrival:<BR> Comments:<BR> EMS

## 2012-04-13 NOTE — ED Provider Notes (Signed)
History     CSN: [id number]  Arrival date & time 04/13/12  0221   First MD Initiated Contact with Patient 04/13/12 0254      Chief Complaint  Patient presents with  . need verification for peg tube placement     (Consider location/radiation/quality/duration/timing/severity/associated sxs/prior treatment) HPI Comments: Patient is brought from nursing home, [facility] where the patient apparently had asked only removed his PEG tube. Patient has prior history of stroke and seizures as well as psychosis. The patient requires full care. Staff was able to replace his PEG tube with a new one and a sent here essentially for x-ray confirmation of placement. Per EMS, the patient was given some Ativan due to agitation prior to being sent here. The patient is reportedly at his usual mental status.  The history is provided by the EMS personnel.    Past Medical History  Diagnosis Date  . GERD (gastroesophageal reflux disease)   . ETOH abuse   . CVA (cerebral vascular accident)   . Psychosis     History reviewed. No pertinent past surgical history.  History reviewed. No pertinent family history.  History  Substance Use Topics  . Smoking status: Not on file  . Smokeless tobacco: Not on file  . Alcohol Use: Not on file      Review of Systems  Unable to perform ROS: Psychiatric disorder    Allergies  Review of patient's allergies indicates no known allergies.  Home Medications  No current outpatient prescriptions on file.  BP 145/105  Pulse 101  Temp(Src) 97.6 F (36.4 C) (Oral)  SpO2 100%  Physical Exam  Nursing note and vitals reviewed. Constitutional: He appears well-developed. He does not have a sickly appearance. He does not appear ill. No distress.  Cardiovascular: Normal rate, regular rhythm, S1 normal and S2 normal.   Pulmonary/Chest: Effort normal. He has no wheezes. He has no rales.  Abdominal: Soft. He exhibits no distension. There is no tenderness.  Skin:  Skin is warm.    ED Course  Procedures (including critical care time)  Labs Reviewed - No data to display No results found.   1. Screening     3:43 AM I reviewed the film myself, appears to be in place.  Will d/c back to facility   MDM  Screening exam performed.  KUB with gastrograffin for placement ordered.          Gavin Pound. Oletta Lamas, MD 04/13/12 [phone]

## 2012-05-13 ENCOUNTER — Emergency Department (HOSPITAL_COMMUNITY): Payer: Non-veteran care

## 2012-05-13 ENCOUNTER — Encounter (HOSPITAL_COMMUNITY): Payer: Self-pay | Admitting: Emergency Medicine

## 2012-05-13 ENCOUNTER — Emergency Department (HOSPITAL_COMMUNITY)
Admission: EM | Admit: 2012-05-13 | Discharge: 2012-05-13 | Disposition: A | Discharge status: Skilled Nursing Facility | Payer: Non-veteran care | Attending: Emergency Medicine | Admitting: Emergency Medicine

## 2012-05-13 DIAGNOSIS — Z79899 Other long term (current) drug therapy: Secondary | ICD-10-CM | POA: Insufficient documentation

## 2012-05-13 DIAGNOSIS — Z7982 Long term (current) use of aspirin: Secondary | ICD-10-CM | POA: Insufficient documentation

## 2012-05-13 DIAGNOSIS — Z431 Encounter for attention to gastrostomy: Secondary | ICD-10-CM | POA: Insufficient documentation

## 2012-05-13 DIAGNOSIS — K219 Gastro-esophageal reflux disease without esophagitis: Secondary | ICD-10-CM | POA: Insufficient documentation

## 2012-05-13 DIAGNOSIS — Z8673 Personal history of transient ischemic attack (TIA), and cerebral infarction without residual deficits: Secondary | ICD-10-CM | POA: Insufficient documentation

## 2012-05-13 DIAGNOSIS — Y849 Medical procedure, unspecified as the cause of abnormal reaction of the patient, or of later complication, without mention of misadventure at the time of the procedure: Secondary | ICD-10-CM | POA: Insufficient documentation

## 2012-05-13 DIAGNOSIS — K9429 Other complications of gastrostomy: Secondary | ICD-10-CM | POA: Insufficient documentation

## 2012-05-13 MED ORDER — IOHEXOL 300 MG/ML  SOLN
50.0000 mL | Freq: Once | INTRAMUSCULAR | Status: AC | PRN
  Administered 2012-05-13: 45 mL

## 2012-05-13 NOTE — ED Provider Notes (Signed)
History     CSN: [id number]  Arrival date & time 05/13/12  1857   First MD Initiated Contact with Patient 05/13/12 2053      Chief Complaint  Patient presents with  . Peg Tube Larey Seat Out     (Consider location/radiation/quality/duration/timing/severity/associated sxs/prior treatment) HPI Comments: Patient is sent over from [facility] nursing home because his feeding tube came out about an hour prior to arrival. This staff did replace it with a Foley. The recent repair for verification of the tube. He apparently has a history of removing his PEG in the past.  The history is provided by the nursing home and the EMS personnel.    Past Medical History  Diagnosis Date  . GERD (gastroesophageal reflux disease)   . ETOH abuse   . CVA (cerebral vascular accident)   . Psychosis     History reviewed. No pertinent past surgical history.  No family history on file.  History  Substance Use Topics  . Smoking status: Not on file  . Smokeless tobacco: Not on file  . Alcohol Use: Not on file      Review of Systems  Constitutional: Negative for fever, chills, diaphoresis and fatigue.  HENT: Negative for congestion, rhinorrhea and sneezing.   Eyes: Negative.   Respiratory: Negative for cough, chest tightness and shortness of breath.   Cardiovascular: Negative for chest pain and leg swelling.  Gastrointestinal: Negative for nausea, vomiting, abdominal pain, diarrhea and blood in stool.  Genitourinary: Negative for frequency, hematuria, flank pain and difficulty urinating.  Musculoskeletal: Negative for back pain and arthralgias.  Skin: Negative for rash.  Neurological: Negative for dizziness, speech difficulty, weakness, numbness and headaches.    Allergies  Fosinopril and Prazosin  Home Medications   Current Outpatient Rx  Name Route Sig Dispense Refill  . ACETAMINOPHEN 325 MG PO TABS PEG Tube 650 mg by PEG Tube route every 6 (six) hours as needed. Pain.    . ASPIRIN 81 MG  PO CHEW PEG Tube 81 mg by PEG Tube route daily.    Marland Kitchen FOLIC ACID 1 MG PO TABS PEG Tube 1 mg by PEG Tube route daily.    Marland Kitchen LANSOPRAZOLE 30 MG PO TBDP PEG Tube 30 mg by PEG Tube route daily.    Marland Kitchen LEVETIRACETAM 100 MG/ML PO SOLN PEG Tube by PEG Tube route See admin instructions. Give 5ml (500mg ) via tube twice daily.    Marland Kitchen LORAZEPAM 1 MG PO TABS PEG Tube 1 mg by PEG Tube route every 6 (six) hours as needed. Anxiety.    Marland Kitchen LORAZEPAM 2 MG/ML IJ SOLN Intravenous Inject 1 mg into the vein every 6 (six) hours as needed. Seizure/agitation.    Marland Kitchen MAGNESIUM OXIDE 400 (241.3 MG) MG PO TABS PEG Tube 400 mg by PEG Tube route every 6 (six) hours.    Marland Kitchen METOPROLOL TARTRATE 25 MG PO TABS PEG Tube 25 mg by PEG Tube route 2 (two) times daily.    . ADULT MULTIVITAMIN LIQUID CH PEG Tube 5 mLs by PEG Tube route daily.    . ISOSOURCE HN PO LIQD PEG Tube 1 Can by PEG Tube route every 4 (four) hours.     Marland Kitchen PRESCRIPTION MEDICATION Topical Apply 0.5 mg topically every 6 (six) hours as needed. Lorazepam 0.5mg /ml topical gel. Apply to wrist every 6 hours as needed for agitation.    Marland Kitchen RISPERIDONE 2 MG PO TBDP PEG Tube 2 mg by PEG Tube route 2 (two) times daily.    Marland Kitchen  VITAMIN B-1 100 MG PO TABS PEG Tube 100 mg by PEG Tube route daily.      BP 135/77  Pulse 97  Temp(Src) 97.8 F (36.6 C) (Oral)  Resp 20  SpO2 95%  Physical Exam  Constitutional: He is oriented to person, place, and time. He appears well-developed and well-nourished.  HENT:  Head: Normocephalic and atraumatic.  Eyes: Pupils are equal, round, and reactive to light.  Neck: Normal range of motion. Neck supple.  Cardiovascular: Normal rate, regular rhythm and normal heart sounds.   Pulmonary/Chest: Effort normal and breath sounds normal. No respiratory distress. He has no wheezes. He has no rales. He exhibits no tenderness.  Abdominal: Soft. Bowel sounds are normal. There is no tenderness. There is no rebound and no guarding.       Patient has a Foley catheter  protruding from his PEG stoma there is no bleeding or signs of infection around the site. Patient has no tenderness on abdominal exam  Musculoskeletal: Normal range of motion. He exhibits no edema.  Lymphadenopathy:    He has no cervical adenopathy.  Neurological: He is alert and oriented to person, place, and time.  Skin: Skin is warm and dry. No rash noted.  Psychiatric: He has a normal mood and affect.    ED Course  Procedures (including critical care time)  Labs Reviewed - No data to display Dg Abd 1 View  05/13/2012  *RADIOLOGY REPORT*  Clinical Data: Confirm PEG tube location.  ABDOMEN - 1 VIEW  Comparison: 04/13/2012  Findings: Following injection of 45 ml of Omnipaque 200 via the patient's gastrostomy tube, there is opacification of the distal stomach.  No extra enteric contrast is identified. Large stool burden.  Advanced vascular calcifications.  Sequelae of prior rib fractures.  No acute osseous finding identified.  IMPRESSION: Contrast opacifies the distal stomach.  Large stool burden.  Original Report Authenticated By: Waneta Martins, M.D.     1. PEG adjustment, replacement, or removal       MDM  Foley appears in place.  Advised NH to make appt for definitive PEG placement tomorrow        Rolan Bucco, MD 05/13/12 2219

## 2012-05-13 NOTE — ED Notes (Signed)
Per Ems pt from [facility] and feeding tube came out 1 hour ago. Foley Cath placed by staff to keep hole open. Pt in no acute distress.

## 2012-05-13 NOTE — ED Notes (Signed)
ZOX:[id number]<AV> Expected date:<BR> Expected time: 6:51 PM<BR> Means of arrival:Ambulance<BR> Comments:<BR> M11 -- Feeding Tube Came Out

## 2012-05-13 NOTE — ED Notes (Signed)
Pt in by Ems  From [facility] and feeding tube came out 1 hour ago and staff placed foley in to keep open.

## 2012-05-13 NOTE — Discharge Instructions (Signed)
Gastric Tube Replacement  You are having your gastric tube (the tube that goes into the stomach) changed. This is usually a very minor procedure. If medications are prescribed, take them as directed. Only take over-the-counter or prescription medications for pain, discomfort, or fever as directed by your caregiver.   SEEK IMMEDIATE MEDICAL CARE IF:    You develop chills, fever, or show signs of generalized illness.   You develop bleeding around the tube.   Your new tube does not seem to be working properly.   You are unable to get feedings into the tube.   Your tube comes out for any reason.  Document Released: 09/05/2001 Document Revised: 11/30/2011 Document Reviewed: 12/11/2005  Exit[employer] Patient Information 2012 [employer].

## 2012-05-13 NOTE — ED Notes (Signed)
Ptar contacted to transport pt back to [facility]

## 2012-08-09 IMAGING — CR DG ABDOMEN 1V
1 series · 1 of 1 positions shown · non-contrast
Comparison: 04/13/2012

CLINICAL DATA: Confirm PEG tube location.

ABDOMEN - 1 VIEW

[ap (kub)]
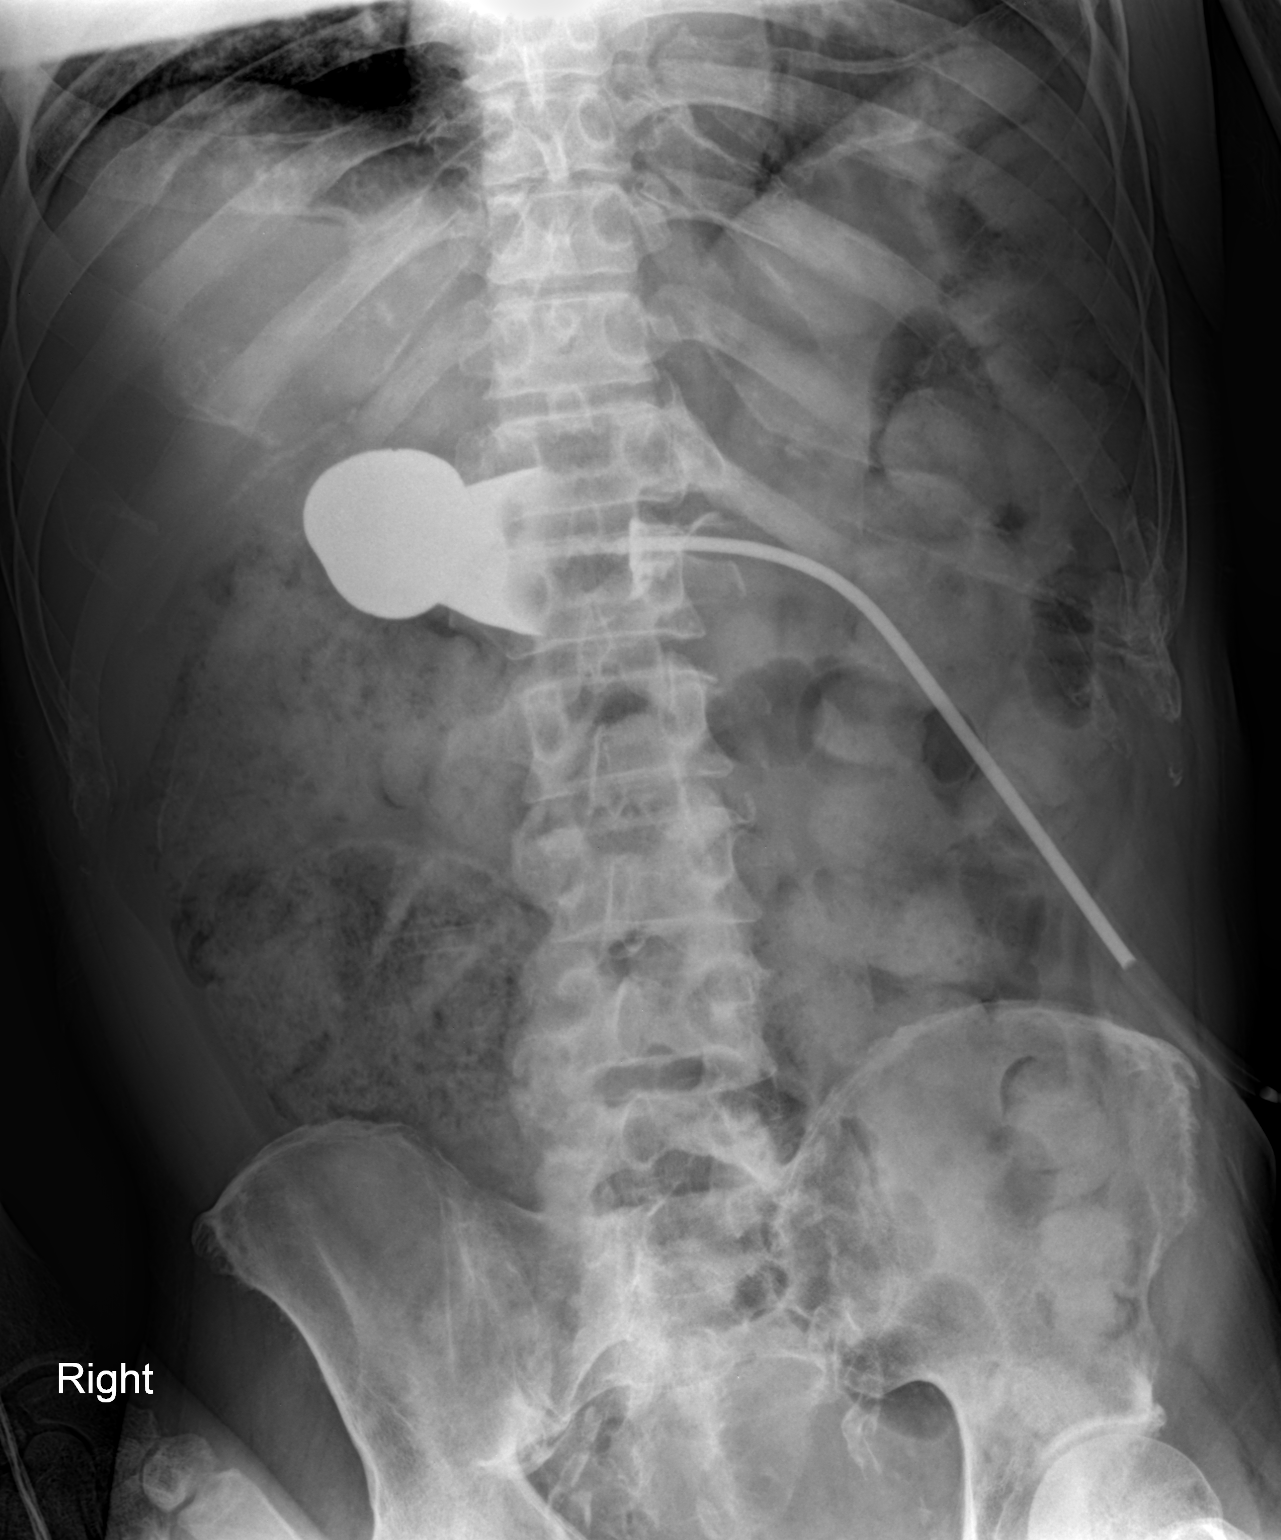

[1 of 1 positions shown; findings below may reference images not displayed]

FINDINGS: Following injection of 45 ml of Omnipaque 200 via the
patient's gastrostomy tube, there is opacification of the distal
stomach.  No extra enteric contrast is identified. Large stool
burden.  Advanced vascular calcifications.  Sequelae of prior rib
fractures.  No acute osseous finding identified.
IMPRESSION: Contrast opacifies the distal stomach.

Large stool burden.

## 2012-10-28 ENCOUNTER — Other Ambulatory Visit (HOSPITAL_BASED_OUTPATIENT_CLINIC_OR_DEPARTMENT_OTHER): Payer: Self-pay | Admitting: Internal Medicine

## 2012-10-28 ENCOUNTER — Ambulatory Visit (HOSPITAL_COMMUNITY)
Admission: RE | Admit: 2012-10-28 | Discharge: 2012-10-28 | Disposition: A | Discharge status: Home / Self Care | Payer: Non-veteran care | Source: Ambulatory Visit | Attending: Internal Medicine | Admitting: Internal Medicine

## 2012-10-28 DIAGNOSIS — I6990 Unspecified sequelae of unspecified cerebrovascular disease: Secondary | ICD-10-CM

## 2012-10-28 DIAGNOSIS — K9423 Gastrostomy malfunction: Secondary | ICD-10-CM | POA: Insufficient documentation

## 2012-10-28 MED ORDER — IOHEXOL 300 MG/ML  SOLN: 50.0000 mL | Freq: Once | INTRAMUSCULAR | Status: AC | PRN

## 2013-03-31 ENCOUNTER — Non-Acute Institutional Stay (SKILLED_NURSING_FACILITY): Payer: PRIVATE HEALTH INSURANCE | Admitting: Internal Medicine

## 2013-03-31 DIAGNOSIS — R569 Unspecified convulsions: Secondary | ICD-10-CM

## 2013-03-31 DIAGNOSIS — F1997 Other psychoactive substance use, unspecified with psychoactive substance-induced persisting dementia: Secondary | ICD-10-CM

## 2013-03-31 DIAGNOSIS — D1739 Benign lipomatous neoplasm of skin and subcutaneous tissue of other sites: Secondary | ICD-10-CM

## 2013-03-31 DIAGNOSIS — I6990 Unspecified sequelae of unspecified cerebrovascular disease: Secondary | ICD-10-CM

## 2013-03-31 DIAGNOSIS — D170 Benign lipomatous neoplasm of skin and subcutaneous tissue of head, face and neck: Secondary | ICD-10-CM

## 2013-04-02 NOTE — Progress Notes (Signed)
PROGRESS NOTE  DATE: 03-31-13  FACILITY: [facility]  LEVEL OF CARE: SNF  Routine Visit  CHIEF COMPLAINT:  Manage neck mass, dementia and seizure disorder  HISTORY OF PRESENT ILLNESS:  REASSESSMENT OF ONGOING PROBLEM(S): 1. neck mass-Staff reports that patient has a large soft fist-sized mass on the back of his neck. There is no pain reported. Staff cannot identify precipitating or alleviating factors. Symptoms are constant. There are no other associated signs and symptoms. 2. SEIZURE DISORDER: The patient's seizure disorder remains stable. No complications reported from the medications presently being used. Staff do not report any recent seizure activity. 3. DEMENTIA: The dementia remaines stable and continues to function adequately in the current living environment with supervision.  The patient has had little changes in behavior. No complications noted from the medications presently being used. Patient does not follow commands.  PAST MEDICAL HISTORY : Reviewed.  No changes.  CURRENT MEDICATIONS: Reviewed per [facility]  REVIEW OF SYSTEMS: Unobtainable secondary to dementia.  PHYSICAL EXAMINATION  VS:  T 97.4       P 74      RR      BP 132/84     POX %     WT (Lb) 130  GENERAL: no acute distress, thin body habitus EYES: conjunctivae normal, sclerae normal, normal eye lids NECK: supple, trachea midline, no thyroid tenderness, no thyromegaly, there is a large soft nontender mass at the posterior neck LYMPHATICS: no LAN in the neck, no supraclavicular LAN RESPIRATORY: breathing is even & unlabored, BS CTAB CARDIAC: RRR, no murmur,no extra heart sounds, no edema GI: abdomen soft, normal BS, no masses, no tenderness, no hepatomegaly, no splenomegaly PSYCHIATRIC: the patient is alert & disoriented , affect & behavior appropriate.  Patient is somewhat agitated.  LABS/RADIOLOGY: 3/14 hemoglobin 11.5, MCV 95 otherwise CBC normal, alkaline phosphatase 2:30, glucose 105, AST 69, ALT 334,  fasting lipid panel normal, hemoglobin A1c 5.9, folate greater than 19.9, magnesium 2 9/13 glucose 125, alkaline phosphatase 139 otherwise CMP normal  ASSESSMENT/PLAN: 1. 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 2. seizure disorder-well-controlled. 3. Dementia 292.82-advanced. 4. Hypomagnesimia-well repeated. Continue supplementation. 5. CVA-stable. 6.GERD-stable.  CPT CODE: 16109

## 2013-04-28 ENCOUNTER — Non-Acute Institutional Stay (SKILLED_NURSING_FACILITY): Payer: Non-veteran care | Admitting: Internal Medicine

## 2013-04-28 DIAGNOSIS — R569 Unspecified convulsions: Secondary | ICD-10-CM

## 2013-04-28 DIAGNOSIS — F1997 Other psychoactive substance use, unspecified with psychoactive substance-induced persisting dementia: Secondary | ICD-10-CM

## 2013-04-28 DIAGNOSIS — I6990 Unspecified sequelae of unspecified cerebrovascular disease: Secondary | ICD-10-CM

## 2013-05-02 DIAGNOSIS — R569 Unspecified convulsions: Secondary | ICD-10-CM | POA: Insufficient documentation

## 2013-05-02 DIAGNOSIS — I6990 Unspecified sequelae of unspecified cerebrovascular disease: Secondary | ICD-10-CM | POA: Insufficient documentation

## 2013-05-02 DIAGNOSIS — F1997 Other psychoactive substance use, unspecified with psychoactive substance-induced persisting dementia: Secondary | ICD-10-CM | POA: Insufficient documentation

## 2013-05-02 NOTE — Progress Notes (Signed)
PROGRESS NOTE  DATE: 04-28-13  FACILITY: [facility]  LEVEL OF CARE: SNF  Routine Visit  CHIEF COMPLAINT:  Manage CVA, dementia and seizure disorder  HISTORY OF PRESENT ILLNESS:  REASSESSMENT OF ONGOING PROBLEM(S):  1. SEIZURE DISORDER: The patient's seizure disorder remains stable. No complications reported from the medications presently being used. Staff do not report any recent seizure activity.  2. DEMENTIA: The dementia remaines stable and continues to function adequately in the current living environment with supervision.  The patient has had little changes in behavior. No complications noted from the medications presently being used. Patient does not follow commands.  3. CVA: The patient's CVA remains stable.  Staff deny new neurologic symptoms such as numbness, tingling, weakness, speech difficulties or visual disturbances.  No complications reported from the medications currently being used.  PAST MEDICAL HISTORY : Reviewed.  No changes.  CURRENT MEDICATIONS: Reviewed per [facility]  REVIEW OF SYSTEMS: Unobtainable secondary to dementia.  PHYSICAL EXAMINATION  VS:  T 98.1       P 79    RR 18     BP 112/60    POX %     WT (Lb) 134  GENERAL: no acute distress, thin body habitus EYES: conjunctivae normal, sclerae normal, normal eye lids NECK: supple, trachea midline, no thyroid tenderness, no thyromegaly LYMPHATICS: no LAN in the neck, no supraclavicular LAN RESPIRATORY: breathing is even & unlabored, BS CTAB CARDIAC: RRR, no murmur,no extra heart sounds, no edema GI: abdomen soft, normal BS, no masses, no tenderness, no hepatomegaly, no splenomegaly PSYCHIATRIC: the patient is alert & disoriented , affect & behavior appropriate.  Patient is somewhat agitated.  LABS/RADIOLOGY: 3/14 hemoglobin 11.5, MCV 95 otherwise CBC normal, alkaline phosphatase 2:30, glucose 105, AST 69, ALT 334, fasting lipid panel normal, hemoglobin A1c 5.9, folate greater than 19.9, magnesium  2 9/13 glucose 125, alkaline phosphatase 139 otherwise CMP normal  ASSESSMENT/PLAN: 1. CVA-continue supportive care. 2. seizure disorder-well-controlled. 3. Dementia 292.82-advanced. 4. Hypomagnesimia-well repeated. Continue supplementation. 5.GERD-stable.  CPT CODE: 96295

## 2013-05-26 ENCOUNTER — Non-Acute Institutional Stay (SKILLED_NURSING_FACILITY): Payer: PRIVATE HEALTH INSURANCE | Admitting: Internal Medicine

## 2013-05-26 DIAGNOSIS — I6990 Unspecified sequelae of unspecified cerebrovascular disease: Secondary | ICD-10-CM

## 2013-05-26 DIAGNOSIS — R569 Unspecified convulsions: Secondary | ICD-10-CM

## 2013-05-26 DIAGNOSIS — F1997 Other psychoactive substance use, unspecified with psychoactive substance-induced persisting dementia: Secondary | ICD-10-CM

## 2013-05-26 NOTE — Progress Notes (Signed)
PROGRESS NOTE  DATE: 05-26-13  FACILITY: [facility]  LEVEL OF CARE: SNF  Routine Visit  CHIEF COMPLAINT:  Manage CVA, dementia and seizure disorder  HISTORY OF PRESENT ILLNESS:  REASSESSMENT OF ONGOING PROBLEM(S):  1. SEIZURE DISORDER: The patient's seizure disorder remains stable. No complications reported from the medications presently being used. Staff do not report any recent seizure activity.  2. DEMENTIA: The dementia remaines stable and continues to function adequately in the current living environment with supervision.  The patient has had little changes in behavior. No complications noted from the medications presently being used. Patient does not follow commands.  3. CVA: The patient's CVA remains stable.  Staff deny new neurologic symptoms such as numbness, tingling, weakness, speech difficulties or visual disturbances.  No complications reported from the medications currently being used.  PAST MEDICAL HISTORY : Reviewed.  No changes.  CURRENT MEDICATIONS: Reviewed per [facility]  REVIEW OF SYSTEMS: Unobtainable secondary to dementia.  PHYSICAL EXAMINATION  VS:  T 96.4    P 56    RR 16    BP 108/68    POX %     WT (Lb) 130  GENERAL: no acute distress, thin body habitus EYES: conjunctivae normal, sclerae normal, normal eye lids NECK: supple, trachea midline, no thyroid tenderness, no thyromegaly LYMPHATICS: no LAN in the neck, no supraclavicular LAN RESPIRATORY: breathing is even & unlabored, BS CTAB CARDIAC: RRR, no murmur,no extra heart sounds, no edema GI: abdomen soft, normal BS, no masses, no tenderness, no hepatomegaly, no splenomegaly PSYCHIATRIC: the patient is alert & disoriented , affect & behavior appropriate.  Patient is somewhat agitated.  LABS/RADIOLOGY: 3/14 hemoglobin 11.5, MCV 95 otherwise CBC normal, alkaline phosphatase 2:30, glucose 105, AST 69, ALT 334, fasting lipid panel normal, hemoglobin A1c 5.9, folate greater than 19.9, magnesium 2 9/13  glucose 125, alkaline phosphatase 139 otherwise CMP normal  ASSESSMENT/PLAN: 1. CVA-continue supportive care. 2. seizure disorder-well-controlled. 3. Dementia 292.82-advanced. 4. Hypomagnesimia-well repeated. Continue supplementation. 5.GERD-stable.  CPT CODE: 16109

## 2013-06-30 ENCOUNTER — Non-Acute Institutional Stay (SKILLED_NURSING_FACILITY): Payer: PRIVATE HEALTH INSURANCE | Admitting: Internal Medicine

## 2013-06-30 DIAGNOSIS — R229 Localized swelling, mass and lump, unspecified: Secondary | ICD-10-CM

## 2013-07-13 ENCOUNTER — Emergency Department (HOSPITAL_COMMUNITY): Payer: Non-veteran care

## 2013-07-13 ENCOUNTER — Encounter (HOSPITAL_COMMUNITY): Payer: Self-pay

## 2013-07-13 ENCOUNTER — Emergency Department (HOSPITAL_COMMUNITY)
Admission: EM | Admit: 2013-07-13 | Discharge: 2013-07-13 | Disposition: A | Discharge status: Home / Self Care | Payer: Non-veteran care | Attending: Emergency Medicine | Admitting: Emergency Medicine

## 2013-07-13 DIAGNOSIS — F0390 Unspecified dementia without behavioral disturbance: Secondary | ICD-10-CM | POA: Insufficient documentation

## 2013-07-13 DIAGNOSIS — Z431 Encounter for attention to gastrostomy: Secondary | ICD-10-CM | POA: Insufficient documentation

## 2013-07-13 DIAGNOSIS — Z931 Gastrostomy status: Secondary | ICD-10-CM

## 2013-07-13 DIAGNOSIS — Z79899 Other long term (current) drug therapy: Secondary | ICD-10-CM | POA: Insufficient documentation

## 2013-07-13 DIAGNOSIS — F1021 Alcohol dependence, in remission: Secondary | ICD-10-CM | POA: Insufficient documentation

## 2013-07-13 DIAGNOSIS — K219 Gastro-esophageal reflux disease without esophagitis: Secondary | ICD-10-CM | POA: Insufficient documentation

## 2013-07-13 DIAGNOSIS — Z8673 Personal history of transient ischemic attack (TIA), and cerebral infarction without residual deficits: Secondary | ICD-10-CM | POA: Insufficient documentation

## 2013-07-13 DIAGNOSIS — Z7982 Long term (current) use of aspirin: Secondary | ICD-10-CM | POA: Insufficient documentation

## 2013-07-13 MED ORDER — IOHEXOL 300 MG/ML  SOLN
20.0000 mL | Freq: Once | INTRAMUSCULAR | Status: AC | PRN
  Administered 2013-07-13: 20 mL via ORAL

## 2013-07-13 NOTE — ED Notes (Signed)
[name] [name] made aware that pt will be coming back to the facility

## 2013-07-13 NOTE — ED Notes (Signed)
Pt presents via EMS for confirmation of his peg tube that he had placed earlier today.

## 2013-07-13 NOTE — ED Notes (Signed)
[id number]> Expected date:<BR> Expected time:<BR> Means of arrival:<BR> Comments:<BR> Hold for [facility]

## 2013-07-13 NOTE — ED Provider Notes (Signed)
History    CSN: [id number] Arrival date & time 07/13/13  1191  First MD Initiated Contact with Patient 07/13/13 1903     Chief Complaint  Patient presents with  . peg tube confirmation     HPI Pt presents via EMS for confirmation of his peg tube that he had placed earlier today.  Past Medical History  Diagnosis Date  . GERD (gastroesophageal reflux disease)   . ETOH abuse   . CVA (cerebral vascular accident)   . Psychosis    History reviewed. No pertinent past surgical history. No family history on file. History  Substance Use Topics  . Smoking status: Not on file  . Smokeless tobacco: Not on file  . Alcohol Use: Not on file    Review of Systems  Unable to perform ROS: Dementia   Level Allergies  Fosinopril and Prazosin  Home Medications   Current Outpatient Rx  Name  Route  Sig  Dispense  Refill  . acetaminophen (TYLENOL) 325 MG tablet   PEG Tube   650 mg by PEG Tube route every 6 (six) hours as needed. Pain.         Marland Kitchen aspirin 81 MG chewable tablet   PEG Tube   81 mg by PEG Tube route daily.         . folic acid (FOLVITE) 1 MG tablet   PEG Tube   1 mg by PEG Tube route daily.         . lansoprazole (PREVACID SOLUTAB) 30 MG disintegrating tablet   PEG Tube   30 mg by PEG Tube route daily.         Marland Kitchen levETIRAcetam (KEPPRA) 100 MG/ML solution   PEG Tube   by PEG Tube route See admin instructions. Give 5ml (500mg ) via tube twice daily.         Marland Kitchen LORazepam (ATIVAN) 1 MG tablet   PEG Tube   1 mg by PEG Tube route every 6 (six) hours as needed. Anxiety.         Marland Kitchen LORazepam (ATIVAN) 2 MG/ML injection   Intravenous   Inject 1 mg into the vein every 6 (six) hours as needed. Seizure/agitation.         . magnesium oxide (MAG-OX 400) 400 (241.3 MG) MG tablet   PEG Tube   400 mg by PEG Tube route every 6 (six) hours.         . metoprolol tartrate (LOPRESSOR) 25 MG tablet   PEG Tube   25 mg by PEG Tube route 2 (two) times daily.         . Multiple Vitamin (MULITIVITAMIN) LIQD   PEG Tube   5 mLs by PEG Tube route daily.         . Nutritional Supplements (ISOSOURCE HN) LIQD   PEG Tube   1 Can by PEG Tube route every 4 (four) hours.          Marland Kitchen PRESCRIPTION MEDICATION   Topical   Apply 0.5 mg topically every 6 (six) hours as needed. Lorazepam 0.5mg /ml topical gel. Apply to wrist every 6 hours as needed for agitation.         . risperiDONE (RISPERDAL M-TABS) 2 MG disintegrating tablet   PEG Tube   2 mg by PEG Tube route 2 (two) times daily.         Marland Kitchen thiamine (VITAMIN B-1) 100 MG tablet   PEG Tube   100 mg by PEG Tube route daily.  BP 143/67  Pulse 62  Temp(Src) 98.7 F (37.1 C) (Oral)  Resp 18  SpO2 96% Physical Exam  Nursing note and vitals reviewed. Constitutional: He appears well-developed and well-nourished. No distress.  HENT:  Head: Normocephalic and atraumatic.  Eyes: Pupils are equal, round, and reactive to light.  Neck: Normal range of motion.  Cardiovascular: Normal rate and intact distal pulses.   Pulmonary/Chest: No respiratory distress.  Abdominal: Normal appearance. He exhibits no distension. There is no rebound and no CVA tenderness.    Musculoskeletal: Normal range of motion.  Neurological: He is alert. No cranial nerve deficit.  Skin: Skin is warm and dry. No rash noted.  Psychiatric: He has a normal mood and affect. His behavior is normal.    ED Course  Procedures (including critical care time) Labs Reviewed - No data to display Dg Abd 1 View  07/13/2013   *RADIOLOGY REPORT*  Clinical Data: Evaluate peg tube  ABDOMEN - 1 VIEW  Comparison: 10/28/2012  Findings: There is an indwelling gastrostomy tube with its balloon and tip within the distal stomach.  Contrast has been injected filling a portion of the stomach and the first and second portions of the duodenum.  There is no contrast extravasation.  IMPRESSION: Well-positioned percutaneous gastrostomy tube.   Original  Report Authenticated By: Amie Portland, M.D.   1. Gastrostomy tube in place     MDM    Nelia Shi, MD 07/13/13 [phone]

## 2013-07-13 NOTE — ED Notes (Signed)
[facility] called for transport.  

## 2013-07-13 NOTE — ED Notes (Signed)
[facility] here to transport patient back to facility 

## 2013-07-16 ENCOUNTER — Non-Acute Institutional Stay (SKILLED_NURSING_FACILITY): Payer: PRIVATE HEALTH INSURANCE | Admitting: Internal Medicine

## 2013-07-16 DIAGNOSIS — I6990 Unspecified sequelae of unspecified cerebrovascular disease: Secondary | ICD-10-CM

## 2013-07-16 DIAGNOSIS — R569 Unspecified convulsions: Secondary | ICD-10-CM

## 2013-07-16 DIAGNOSIS — F1997 Other psychoactive substance use, unspecified with psychoactive substance-induced persisting dementia: Secondary | ICD-10-CM

## 2013-07-16 NOTE — Progress Notes (Signed)
PROGRESS NOTE  DATE: 07-16-13  FACILITY: [facility]  LEVEL OF CARE: SNF  Routine Visit  CHIEF COMPLAINT:  Manage CVA, dementia and seizure disorder  HISTORY OF PRESENT ILLNESS:  REASSESSMENT OF ONGOING PROBLEM(S):  SEIZURE DISORDER: The patient's seizure disorder remains stable. No complications reported from the medications presently being used. Staff do not report any recent seizure activity.  DEMENTIA: The dementia remaines stable and continues to function adequately in the current living environment with supervision.  The patient has had little changes in behavior. No complications noted from the medications presently being used. Patient does not follow commands.  CVA: The patient's CVA remains stable.  Staff deny new neurologic symptoms such as numbness, tingling, weakness, speech difficulties or visual disturbances.  No complications reported from the medications currently being used.  PAST MEDICAL HISTORY : Reviewed.  No changes.  CURRENT MEDICATIONS: Reviewed per [facility]  REVIEW OF SYSTEMS: Unobtainable secondary to dementia.  PHYSICAL EXAMINATION  VS:  T 97    P 60    RR 18    BP 112/68    POX %     WT (Lb) 135  GENERAL: no acute distress, thin body habitus EYES: conjunctivae normal, sclerae normal, normal eye lids NECK: supple, trachea midline, no thyroid tenderness, no thyromegaly LYMPHATICS: no LAN in the neck, no supraclavicular LAN RESPIRATORY: breathing is even & unlabored, BS CTAB CARDIAC: RRR, no murmur,no extra heart sounds, no edema GI: abdomen soft, normal BS, no masses, no tenderness, no hepatomegaly, no splenomegaly PSYCHIATRIC: the patient is alert & disoriented , affect & behavior appropriate.  Patient is somewhat agitated.  LABS/RADIOLOGY: 3/14 hemoglobin 11.5, MCV 95 otherwise CBC normal, alkaline phosphatase 2:30, glucose 105, AST 69, ALT 334, fasting lipid panel normal, hemoglobin A1c 5.9, folate greater than 19.9, magnesium 2 9/13 glucose 125,  alkaline phosphatase 139 otherwise CMP normal  ASSESSMENT/PLAN: CVA-continue supportive care. seizure disorder-well-controlled. Dementia 292.82-advanced. Hypomagnesimia-well repleated. Continue supplementation. GERD-stable. Depression-on Paxil  CPT CODE: 86578

## 2013-07-23 NOTE — Progress Notes (Signed)
Patient ID: [name] [name], male   DOB: [birth year], 67 y.o.   MRN: [id number]        PROGRESS NOTE  DATE: 06/30/2013  FACILITY:  [facility]  LEVEL OF CARE: SNF (31)  Acute Visit  CHIEF COMPLAINT:  Manage nodule on left temporal area.    HISTORY OF PRESENT ILLNESS: I was requested by the staff to assess the patient regarding above problem(s):  Staff report that patient has a raised area on the left temporal area, noted a few days ago.  Staff cannot identify precipitating or alleviating factors.  Patient is a poor historian due to dementia.      PAST MEDICAL HISTORY : Reviewed.  No changes.  CURRENT MEDICATIONS: Reviewed per [facility]  PHYSICAL EXAMINATION  GENERAL: no acute distress, normal body habitus SKIN:  INSPECTION:  There is a 2 cm sized, firm, nontender nodule on the left temple.  There is no erythema, edema, or warmth.   PSYCHIATRIC: the patient is minimally alert, disoriented, affect & behavior appropriate  ASSESSMENT/PLAN:  Nodule on left temple.  New problem.  Patient is asymptomatic.  Monitor for now.    CPT CODE: 78295

## 2013-07-24 DIAGNOSIS — R229 Localized swelling, mass and lump, unspecified: Secondary | ICD-10-CM | POA: Insufficient documentation

## 2013-08-11 ENCOUNTER — Non-Acute Institutional Stay (SKILLED_NURSING_FACILITY): Payer: PRIVATE HEALTH INSURANCE | Admitting: Internal Medicine

## 2013-08-11 DIAGNOSIS — I6990 Unspecified sequelae of unspecified cerebrovascular disease: Secondary | ICD-10-CM

## 2013-08-11 DIAGNOSIS — R569 Unspecified convulsions: Secondary | ICD-10-CM

## 2013-08-11 DIAGNOSIS — F1997 Other psychoactive substance use, unspecified with psychoactive substance-induced persisting dementia: Secondary | ICD-10-CM

## 2013-08-16 NOTE — Progress Notes (Signed)
PROGRESS NOTE  DATE: 08-11-13  FACILITY: [facility]  LEVEL OF CARE: SNF  Routine Visit  CHIEF COMPLAINT:  Manage CVA, dementia and seizure disorder  HISTORY OF PRESENT ILLNESS:  REASSESSMENT OF ONGOING PROBLEM(S):  SEIZURE DISORDER: The patient's seizure disorder remains stable. No complications reported from the medications presently being used. Staff do not report any recent seizure activity.  DEMENTIA: The dementia remaines stable and continues to function adequately in the current living environment with supervision.  The patient has had little changes in behavior. No complications noted from the medications presently being used. Patient does not follow commands.  CVA: The patient's CVA remains stable.  Staff deny new neurologic symptoms such as numbness, tingling, weakness, speech difficulties or visual disturbances.  No complications reported from the medications currently being used.  PAST MEDICAL HISTORY : Reviewed.  No changes.  CURRENT MEDICATIONS: Reviewed per [facility]  REVIEW OF SYSTEMS: Unobtainable secondary to dementia.  PHYSICAL EXAMINATION  VS:  T 96.8    P 62    RR 12    BP 110/50    POX %     WT (Lb) 135  GENERAL: no acute distress, thin body habitus EYES: conjunctivae normal, sclerae normal, normal eye lids NECK: supple, trachea midline, no thyroid tenderness, no thyromegaly LYMPHATICS: no LAN in the neck, no supraclavicular LAN RESPIRATORY: breathing is even & unlabored, BS CTAB CARDIAC: RRR, no murmur,no extra heart sounds, no edema GI: abdomen soft, normal BS, no masses, no tenderness, no hepatomegaly, no splenomegaly PSYCHIATRIC: the patient is alert & disoriented , affect & behavior appropriate.  Patient is somewhat agitated.  LABS/RADIOLOGY: 3/14 hemoglobin 11.5, MCV 95 otherwise CBC normal, alkaline phosphatase 2:30, glucose 105, AST 69, ALT 334, fasting lipid panel normal, hemoglobin A1c 5.9, folate greater than 19.9, magnesium 2 9/13 glucose  125, alkaline phosphatase 139 otherwise CMP normal  ASSESSMENT/PLAN: CVA-continue supportive care. seizure disorder-well-controlled. Dementia 292.82-advanced. Hypomagnesimia-well repleated. Continue supplementation. GERD-stable. Depression-on Paxil  CPT CODE: 16109

## 2013-09-01 ENCOUNTER — Non-Acute Institutional Stay (SKILLED_NURSING_FACILITY): Payer: PRIVATE HEALTH INSURANCE | Admitting: Internal Medicine

## 2013-09-01 DIAGNOSIS — R569 Unspecified convulsions: Secondary | ICD-10-CM

## 2013-09-01 DIAGNOSIS — F1997 Other psychoactive substance use, unspecified with psychoactive substance-induced persisting dementia: Secondary | ICD-10-CM

## 2013-09-01 DIAGNOSIS — I6990 Unspecified sequelae of unspecified cerebrovascular disease: Secondary | ICD-10-CM

## 2013-09-01 NOTE — Progress Notes (Signed)
PROGRESS NOTE  DATE: 09-01-13  FACILITY: [facility]  LEVEL OF CARE: SNF  Routine Visit  CHIEF COMPLAINT:  Manage CVA, dementia and seizure disorder  HISTORY OF PRESENT ILLNESS:  REASSESSMENT OF ONGOING PROBLEM(S):  SEIZURE DISORDER: The patient's seizure disorder remains stable. No complications reported from the medications presently being used. Staff do not report any recent seizure activity.  DEMENTIA: The dementia remaines stable and continues to function adequately in the current living environment with supervision.  The patient has had little changes in behavior. No complications noted from the medications presently being used. Patient does not follow commands.  CVA: The patient's CVA remains stable.  Staff deny new neurologic symptoms such as numbness, tingling, weakness, speech difficulties or visual disturbances.  No complications reported from the medications currently being used.  PAST MEDICAL HISTORY : Reviewed.  No changes.  CURRENT MEDICATIONS: Reviewed per [facility]  REVIEW OF SYSTEMS: Unobtainable secondary to dementia.  PHYSICAL EXAMINATION  VS:  T 97.9    P 61    RR 18    BP 95/52    POX %     WT (Lb) 140  GENERAL: no acute distress, thin body habitus EYES: conjunctivae normal, sclerae normal, normal eye lids NECK: supple, trachea midline, no thyroid tenderness, no thyromegaly LYMPHATICS: no LAN in the neck, no supraclavicular LAN RESPIRATORY: breathing is even & unlabored, BS CTAB CARDIAC: RRR, no murmur,no extra heart sounds, no edema GI: abdomen soft, normal BS, no masses, no tenderness, no hepatomegaly, no splenomegaly PSYCHIATRIC: the patient is alert & disoriented , affect & behavior appropriate.  Patient is somewhat agitated.  LABS/RADIOLOGY:  9-14 hemoglobin 12.3, MCV 92 otherwise CBC normal, glucose 129, albumin 3.4, alkaline phosphatase 137 otherwise CMP normal, HDL 30 otherwise fasting lipid panel normal, hemoglobin A1c 6.1, folate greater than  19.9, magnesium 1.7 3/14 hemoglobin 11.5, MCV 95 otherwise CBC normal, alkaline phosphatase 2:30, glucose 105, AST 69, ALT 334, fasting lipid panel normal, hemoglobin A1c 5.9, folate greater than 19.9, magnesium 2 9/13 glucose 125, alkaline phosphatase 139 otherwise CMP normal  ASSESSMENT/PLAN: CVA-continue supportive care. seizure disorder-well-controlled. Dementia 292.82-advanced. Risperdal increased Hypomagnesimia-well repleated. Continue supplementation. GERD-stable. Depression-on Paxil  CPT CODE: 16109

## 2013-09-10 ENCOUNTER — Other Ambulatory Visit: Payer: Self-pay | Admitting: *Deleted

## 2013-09-10 MED ORDER — LORAZEPAM 1 MG PO TABS: ORAL_TABLET | ORAL | Status: DC

## 2013-09-27 ENCOUNTER — Non-Acute Institutional Stay (SKILLED_NURSING_FACILITY): Payer: PRIVATE HEALTH INSURANCE | Admitting: Internal Medicine

## 2013-09-27 DIAGNOSIS — R109 Unspecified abdominal pain: Secondary | ICD-10-CM

## 2013-09-27 DIAGNOSIS — R112 Nausea with vomiting, unspecified: Secondary | ICD-10-CM

## 2013-09-29 ENCOUNTER — Non-Acute Institutional Stay (SKILLED_NURSING_FACILITY): Payer: PRIVATE HEALTH INSURANCE | Admitting: Internal Medicine

## 2013-09-29 DIAGNOSIS — F1997 Other psychoactive substance use, unspecified with psychoactive substance-induced persisting dementia: Secondary | ICD-10-CM

## 2013-09-29 DIAGNOSIS — I6990 Unspecified sequelae of unspecified cerebrovascular disease: Secondary | ICD-10-CM

## 2013-09-29 DIAGNOSIS — R569 Unspecified convulsions: Secondary | ICD-10-CM

## 2013-10-04 NOTE — Progress Notes (Signed)
PROGRESS NOTE  DATE: 09-29-13  FACILITY: [facility]  LEVEL OF CARE: SNF  Routine Visit  CHIEF COMPLAINT:  Manage CVA, dementia and seizure disorder  HISTORY OF PRESENT ILLNESS:  REASSESSMENT OF ONGOING PROBLEM(S):  SEIZURE DISORDER: The patient's seizure disorder remains stable. No complications reported from the medications presently being used. Staff do not report any recent seizure activity.  DEMENTIA: The dementia remaines stable and continues to function adequately in the current living environment with supervision.  The patient has had little changes in behavior. No complications noted from the medications presently being used. Patient does not follow commands.  CVA: The patient's CVA remains stable.  Staff deny new neurologic symptoms such as numbness, tingling, weakness, speech difficulties or visual disturbances.  No complications reported from the medications currently being used.  PAST MEDICAL HISTORY : Reviewed.  No changes.  CURRENT MEDICATIONS: Reviewed per [facility]  REVIEW OF SYSTEMS: Unobtainable secondary to dementia.  PHYSICAL EXAMINATION  VS:  T 98.3    P 74    RR 18    BP 100/58    POX %     WT (Lb) 141  GENERAL: no acute distress, thin body habitus EYES: conjunctivae normal, sclerae normal, normal eye lids NECK: supple, trachea midline, no thyroid tenderness, no thyromegaly LYMPHATICS: no LAN in the neck, no supraclavicular LAN RESPIRATORY: breathing is even & unlabored, BS CTAB CARDIAC: RRR, no murmur,no extra heart sounds, no edema GI: abdomen soft, normal BS, no masses, no tenderness, no hepatomegaly, no splenomegaly PSYCHIATRIC: the patient is alert & disoriented , affect & behavior appropriate.  Patient is somewhat agitated.  LABS/RADIOLOGY:  10-14 hemoglobin 12.4, MCV 94 otherwise CBC normal, glucose 123 otherwise CMP normal  9-14 hemoglobin 12.3, MCV 92 otherwise CBC normal, glucose 129, albumin 3.4, alkaline phosphatase 137 otherwise CMP  normal, HDL 30 otherwise fasting lipid panel normal, hemoglobin A1c 6.1, folate greater than 19.9, magnesium 1.7 3/14 hemoglobin 11.5, MCV 95 otherwise CBC normal, alkaline phosphatase 2:30, glucose 105, AST 69, ALT 334, fasting lipid panel normal, hemoglobin A1c 5.9, folate greater than 19.9, magnesium 2 9/13 glucose 125, alkaline phosphatase 139 otherwise CMP normal  ASSESSMENT/PLAN:  CVA-continue supportive care. seizure disorder-well-controlled. Dementia 292.82-advanced.  Hypomagnesimia-well repleated. Continue supplementation. GERD-Prevacid was increased. Depression-on Paxil Anemia of chronic disease-stable  CPT CODE: 16109

## 2013-10-06 ENCOUNTER — Non-Acute Institutional Stay (SKILLED_NURSING_FACILITY): Payer: PRIVATE HEALTH INSURANCE | Admitting: Internal Medicine

## 2013-10-06 DIAGNOSIS — D638 Anemia in other chronic diseases classified elsewhere: Secondary | ICD-10-CM

## 2013-10-06 NOTE — Progress Notes (Signed)
PROGRESS NOTE  DATE: 10/06/2013  FACILITY:  [facility]  LEVEL OF CARE: SNF (31)  Acute Visit  CHIEF COMPLAINT:  Manage anemia  HISTORY OF PRESENT ILLNESS: I was requested by the staff to assess the patient regarding above problem(s):  ANEMIA: The anemia is unstable. The staff deny fatigue, melena or hematochezia. Pt is currently not on iron.  On 09-29-13 Hb 11.2, mcv 94.  On 09-26-13 Hb 12.4.  PAST MEDICAL HISTORY : Reviewed.  No changes.  CURRENT MEDICATIONS: Reviewed per [facility]  PHYSICAL EXAMINATION  GENERAL: no acute distress, thin body habitus RESPIRATORY: breathing is even & unlabored, BS CTAB CARDIAC: RRR, no murmur,no extra heart sounds, no edema  LABS/RADIOLOGY: see HPI  ASSESSMENT/PLAN:  Anemia of chronic dz- Hb declined.  Will monitor.  CPT CODE: 16109

## 2013-10-07 NOTE — Progress Notes (Addendum)
Patient ID: [name] [name], male   DOB: [birth year], 67 y.o.   MRN: [id number]           PROGRESS NOTE  DATE:  09/26/2013  FACILITY: [facility]    LEVEL OF CARE:   SNF   Acute Visit   HISTORY OF PRESENT ILLNESS:   This is a gentleman with a past history of stroke, psychosis, and seizure disorder.  He has been a longstanding resident of this facility.    He is PEG tube dependent.  Noted today to have some vomitus containing a black-colored fluid.  He was complaining of abdominal pain.  He received Phenergan IM.     PAST MEDICAL HISTORY/PROBLEM LIST:    History of seizure disorder.    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     PEG tube dependency, long-term.    History of psychosis requiring psychiatric medications.    History of venous embolism.    History of atypical chest pain.    CURRENT MEDICATIONS:  Medication list is reviewed.     Bayer aspirin 81 q.d.    Prevacid 30 mg b.i.d.    Vitamin B1, 100 daily.    Paxil 20 q.d.    Keppra 500 twice daily.    Mag-Ox 400, 1 tablet every six hours.    Risperdal 2 mg b.i.d. for psychosis.    Metoprolol 12.5 b.i.d.      PHYSICAL EXAMINATION:   VITAL SIGNS:   BLOOD PRESSURE:  148/82.   PULSE:  78.   TEMPERATURE:   98.4.   RESPIRATIONS:  22.   O2 SATURATIONS:  92%.   CHEST/RESPIRATORY:  Clear air entry bilaterally.   CARDIOVASCULAR:  CARDIAC:   Heart sounds are normal.  There are no murmurs.   GASTROINTESTINAL:  ABDOMEN:   Mildly distended.  However, bowel sounds are positive.  There is no tenderness.  No masses.  PEG site looks fine.   GENITOURINARY:  BLADDER:   Not distended.  There is no costovertebral angle tenderness.    ASSESSMENT/PLAN:  Vomiting of dark-colored material.  Whether this is true "coffee grounds" or not, I am uncertain.  I am going to send stat labs and monitor him over the course of the afternoon.  I will repeat his CBC on Monday.  I have increased his Prevacid to b.i.d.  We  will check his stools for guaiac.  Unfortunately, the vomitus was not tested for blood.      Abdominal pain.  If this is true, he is certainly not in any distress now.  His abdominal exam is benign.  He has, however, had Phenergan.    Again, right now I think monitoring his status is in order.  Follow-up labs, also.

## 2013-10-28 ENCOUNTER — Non-Acute Institutional Stay (SKILLED_NURSING_FACILITY): Payer: PRIVATE HEALTH INSURANCE | Admitting: Internal Medicine

## 2013-10-28 DIAGNOSIS — I6990 Unspecified sequelae of unspecified cerebrovascular disease: Secondary | ICD-10-CM

## 2013-10-28 DIAGNOSIS — F1997 Other psychoactive substance use, unspecified with psychoactive substance-induced persisting dementia: Secondary | ICD-10-CM

## 2013-10-28 DIAGNOSIS — R569 Unspecified convulsions: Secondary | ICD-10-CM

## 2013-10-28 NOTE — Progress Notes (Signed)
PROGRESS NOTE  DATE: 10-28-13  FACILITY: [facility]  LEVEL OF CARE: SNF  Routine Visit  CHIEF COMPLAINT:  Manage CVA, dementia and seizure disorder  HISTORY OF PRESENT ILLNESS:  REASSESSMENT OF ONGOING PROBLEM(S):  SEIZURE DISORDER: The patient's seizure disorder remains stable. No complications reported from the medications presently being used. Staff do not report any recent seizure activity.  DEMENTIA: The dementia remaines stable and continues to function adequately in the current living environment with supervision.  The patient has had little changes in behavior. No complications noted from the medications presently being used. Patient does not follow commands.  CVA: The patient's CVA remains stable.  Staff deny new neurologic symptoms such as numbness, tingling, weakness, speech difficulties or visual disturbances.  No complications reported from the medications currently being used.  PAST MEDICAL HISTORY : Reviewed.  No changes.  CURRENT MEDICATIONS: Reviewed per [facility]  REVIEW OF SYSTEMS: Unobtainable secondary to dementia.  PHYSICAL EXAMINATION  VS:  T 96.3    P 74    RR 18    BP 102/62    POX %     WT (Lb) 138  GENERAL: no acute distress, thin body habitus EYES: conjunctivae normal, sclerae normal, normal eye lids NECK: supple, trachea midline, no thyroid tenderness, no thyromegaly LYMPHATICS: no LAN in the neck, no supraclavicular LAN RESPIRATORY: breathing is even & unlabored, BS CTAB CARDIAC: RRR, no murmur,no extra heart sounds, no edema GI: abdomen soft, normal BS, no masses, no tenderness, no hepatomegaly, no splenomegaly PSYCHIATRIC: the patient is alert & disoriented , affect & behavior appropriate.  Patient is somewhat agitated.  LABS/RADIOLOGY:  10-14 hemoglobin 12.4, MCV 94 otherwise CBC normal, glucose 123 otherwise CMP normal. Repeat CBC: Hemoglobin 11.2 MCV 94 otherwise CBC normal  9-14 hemoglobin 12.3, MCV 92 otherwise CBC normal, glucose  129, albumin 3.4, alkaline phosphatase 137 otherwise CMP normal, HDL 30 otherwise fasting lipid panel normal, hemoglobin A1c 6.1, folate greater than 19.9, magnesium 1.7 3/14 hemoglobin 11.5, MCV 95 otherwise CBC normal, alkaline phosphatase 2:30, glucose 105, AST 69, ALT 334, fasting lipid panel normal, hemoglobin A1c 5.9, folate greater than 19.9, magnesium 2 9/13 glucose 125, alkaline phosphatase 139 otherwise CMP normal  ASSESSMENT/PLAN:  CVA-continue supportive care. seizure disorder-well-controlled. Dementia 292.82-advanced.  Hypomagnesimia-well repleted. Continue supplementation. GERD-stable Depression-on Paxil Anemia of chronic disease-stable  CPT CODE: 16109

## 2013-11-11 ENCOUNTER — Non-Acute Institutional Stay (SKILLED_NURSING_FACILITY): Payer: PRIVATE HEALTH INSURANCE | Admitting: Internal Medicine

## 2013-11-11 DIAGNOSIS — J189 Pneumonia, unspecified organism: Secondary | ICD-10-CM

## 2013-12-09 ENCOUNTER — Encounter: Payer: Self-pay | Admitting: Internal Medicine

## 2013-12-09 ENCOUNTER — Non-Acute Institutional Stay (SKILLED_NURSING_FACILITY): Payer: PRIVATE HEALTH INSURANCE | Admitting: Internal Medicine

## 2013-12-09 DIAGNOSIS — I6990 Unspecified sequelae of unspecified cerebrovascular disease: Secondary | ICD-10-CM

## 2013-12-09 DIAGNOSIS — F1997 Other psychoactive substance use, unspecified with psychoactive substance-induced persisting dementia: Secondary | ICD-10-CM

## 2013-12-09 DIAGNOSIS — R569 Unspecified convulsions: Secondary | ICD-10-CM

## 2013-12-09 NOTE — Progress Notes (Signed)
PROGRESS NOTE  DATE: 12-09-13  FACILITY: [facility]  LEVEL OF CARE: SNF  Routine Visit  CHIEF COMPLAINT:  Manage CVA, dementia and seizure disorder  HISTORY OF PRESENT ILLNESS:  REASSESSMENT OF ONGOING PROBLEM(S):  SEIZURE DISORDER: The patient's seizure disorder remains stable. No complications reported from the medications presently being used. Staff do not report any recent seizure activity.  DEMENTIA: The dementia remaines stable and continues to function adequately in the current living environment with supervision.  The patient has had little changes in behavior. No complications noted from the medications presently being used. Patient does not follow commands.  CVA: The patient's CVA remains stable.  Staff deny new neurologic symptoms such as numbness, tingling, weakness, speech difficulties or visual disturbances.  No complications reported from the medications currently being used.  PAST MEDICAL HISTORY : Reviewed.  No changes.  CURRENT MEDICATIONS: Reviewed per [facility]  REVIEW OF SYSTEMS: Unobtainable secondary to dementia.  PHYSICAL EXAMINATION  VS:  T 98.1    P 71    RR 20    BP 92/62    POX %     WT (Lb) 141  GENERAL: no acute distress, thin body habitus EYES: conjunctivae normal, sclerae normal, normal eye lids NECK: supple, trachea midline, no thyroid tenderness, no thyromegaly LYMPHATICS: no LAN in the neck, no supraclavicular LAN RESPIRATORY: breathing is even & unlabored, BS CTAB CARDIAC: RRR, no murmur,no extra heart sounds, no edema GI: abdomen soft, normal BS, no masses, no tenderness, no hepatomegaly, no splenomegaly PSYCHIATRIC: the patient is alert & disoriented , affect & behavior appropriate.  Patient is somewhat agitated.  LABS/RADIOLOGY:  11-14 hemoglobin 11.7, MCV 94 otherwise CBC normal  10-14 hemoglobin 12.4, MCV 94 otherwise CBC normal, glucose 123 otherwise CMP normal. Repeat CBC: Hemoglobin 11.2 MCV 94 otherwise CBC normal  9-14  hemoglobin 12.3, MCV 92 otherwise CBC normal, glucose 129, albumin 3.4, alkaline phosphatase 137 otherwise CMP normal, HDL 30 otherwise fasting lipid panel normal, hemoglobin A1c 6.1, folate greater than 19.9, magnesium 1.7 3/14 hemoglobin 11.5, MCV 95 otherwise CBC normal, alkaline phosphatase 2:30, glucose 105, AST 69, ALT 334, fasting lipid panel normal, hemoglobin A1c 5.9, folate greater than 19.9, magnesium 2 9/13 glucose 125, alkaline phosphatase 139 otherwise CMP normal  ASSESSMENT/PLAN:  CVA-continue supportive care. seizure disorder-well-controlled. Dementia 292.82-advanced.  Hypomagnesimia-well repleted. Continue supplementation. GERD-stable Depression-on Paxil Anemia of chronic disease-stable  CPT CODE: 40981

## 2013-12-24 ENCOUNTER — Encounter: Payer: Self-pay | Admitting: Internal Medicine

## 2013-12-24 DIAGNOSIS — J189 Pneumonia, unspecified organism: Secondary | ICD-10-CM | POA: Insufficient documentation

## 2013-12-24 NOTE — Progress Notes (Signed)
Patient ID: [name] [name], male   DOB: July 19, 1946, 67 y.o.   MRN: [id number]        PROGRESS NOTE  DATE: 11/11/2013    FACILITY:  [facility]  LEVEL OF CARE: SNF (31)  Acute Visit  CHIEF COMPLAINT:  Manage fever.    HISTORY OF PRESENT ILLNESS: I was requested by the staff to assess the patient regarding above problem(s):  Staff report that patient continues to have a low-grade fever despite being on Avelox, day #4, for aspiration pneumonia.  Patient does not follow commands.  Staff also report thick mucus production.     PAST MEDICAL HISTORY : Reviewed.  No changes.  CURRENT MEDICATIONS: Reviewed per [facility]  REVIEW OF SYSTEMS:  Unobtainable due to dementia.    PHYSICAL EXAMINATION  VS:  T 100.4       P 92      RR 22      BP 94/56     POX %       WT (Lb)  GENERAL: no acute distress, normal body habitus NECK: supple, trachea midline, no neck masses, no thyroid tenderness, no thyromegaly LYMPHATICS: no LAN in the neck, no supraclavicular LAN RESPIRATORY: decreased breath sounds bilaterally   CARDIAC: RRR, no murmur,no extra heart sounds, no edema GI: abdomen soft, normal BS, no masses, no tenderness, no hepatomegaly, no splenomegaly, patient has a PEG    ASSESSMENT/PLAN:  Pneumonia.  Continue Avelox as prescribed.  Monitor vital signs closely.  May use p.r.n. Tylenol for fever.    THN Metrics:   Nonsmoker.  Aspirin 81 mg q.d.    CPT CODE: 78295

## 2013-12-30 ENCOUNTER — Non-Acute Institutional Stay (SKILLED_NURSING_FACILITY): Payer: PRIVATE HEALTH INSURANCE | Admitting: Internal Medicine

## 2013-12-30 DIAGNOSIS — I6990 Unspecified sequelae of unspecified cerebrovascular disease: Secondary | ICD-10-CM

## 2013-12-30 DIAGNOSIS — R569 Unspecified convulsions: Secondary | ICD-10-CM

## 2013-12-30 DIAGNOSIS — F1997 Other psychoactive substance use, unspecified with psychoactive substance-induced persisting dementia: Secondary | ICD-10-CM

## 2013-12-31 NOTE — Progress Notes (Signed)
PROGRESS NOTE  DATE: 12-30-13  FACILITY: [facility]  LEVEL OF CARE: SNF  Routine Visit  CHIEF COMPLAINT:  Manage CVA, dementia and seizure disorder  HISTORY OF PRESENT ILLNESS:  REASSESSMENT OF ONGOING PROBLEM(S):  SEIZURE DISORDER: The patient's seizure disorder remains stable. No complications reported from the medications presently being used. Staff do not report any recent seizure activity.  DEMENTIA: The dementia remaines stable and continues to function adequately in the current living environment with supervision.  The patient has had little changes in behavior. No complications noted from the medications presently being used. Patient does not follow commands.  CVA: The patient's CVA remains stable.  Staff deny new neurologic symptoms such as numbness, tingling, weakness, speech difficulties or visual disturbances.  No complications reported from the medications currently being used.  PAST MEDICAL HISTORY : Reviewed.  No changes.  CURRENT MEDICATIONS: Reviewed per [facility]  REVIEW OF SYSTEMS: Unobtainable secondary to dementia.  PHYSICAL EXAMINATION  VS:  T 97.3    P 68    RR 18    BP 98/64     WT (Lb) 142  GENERAL: no acute distress, thin body habitus EYES: conjunctivae normal, sclerae normal, normal eye lids NECK: supple, trachea midline, no thyroid tenderness, no thyromegaly LYMPHATICS: no LAN in the neck, no supraclavicular LAN RESPIRATORY: breathing is even & unlabored, BS CTAB CARDIAC: RRR, no murmur,no extra heart sounds, no edema GI: abdomen soft, normal BS, no masses, no tenderness, no hepatomegaly, no splenomegaly PSYCHIATRIC: the patient is alert & disoriented , affect & behavior appropriate.  Patient is somewhat agitated.  LABS/RADIOLOGY: 12-14 hemoglobin 10.7, MCV 94 otherwise CBC normal, glucose 119 otherwise BMP normal  11-14 hemoglobin 11.7, MCV 94 otherwise CBC normal  10-14 hemoglobin 12.4, MCV 94 otherwise CBC normal, glucose 123 otherwise CMP  normal. Repeat CBC: Hemoglobin 11.2 MCV 94 otherwise CBC normal  9-14 hemoglobin 12.3, MCV 92 otherwise CBC normal, glucose 129, albumin 3.4, alkaline phosphatase 137 otherwise CMP normal, HDL 30 otherwise fasting lipid panel normal, hemoglobin A1c 6.1, folate greater than 19.9, magnesium 1.7 3/14 hemoglobin 11.5, MCV 95 otherwise CBC normal, alkaline phosphatase 2:30, glucose 105, AST 69, ALT 334, fasting lipid panel normal, hemoglobin A1c 5.9, folate greater than 19.9, magnesium 2 9/13 glucose 125, alkaline phosphatase 139 otherwise CMP normal  ASSESSMENT/PLAN:  CVA-continue supportive care. seizure disorder-well-controlled. Dementia 292.82-advanced.  Hypomagnesimia-well repleted. Continue supplementation. GERD-stable Depression-on Paxil Anemia of chronic disease-stable  CPT CODE: 96295

## 2014-02-03 ENCOUNTER — Non-Acute Institutional Stay (SKILLED_NURSING_FACILITY): Payer: PRIVATE HEALTH INSURANCE | Admitting: Internal Medicine

## 2014-02-03 DIAGNOSIS — I6990 Unspecified sequelae of unspecified cerebrovascular disease: Secondary | ICD-10-CM

## 2014-02-03 DIAGNOSIS — R569 Unspecified convulsions: Secondary | ICD-10-CM

## 2014-02-03 DIAGNOSIS — F1997 Other psychoactive substance use, unspecified with psychoactive substance-induced persisting dementia: Secondary | ICD-10-CM

## 2014-02-03 NOTE — Progress Notes (Signed)
        PROGRESS NOTE  DATE: 02-03-14  FACILITY: [facility]  LEVEL OF CARE: SNF  Routine Visit  CHIEF COMPLAINT:  Manage CVA, dementia and seizure disorder  HISTORY OF PRESENT ILLNESS:  REASSESSMENT OF ONGOING PROBLEM(S):  SEIZURE DISORDER: The patient's seizure disorder remains stable. No complications reported from the medications presently being used. Staff do not report any recent seizure activity.  DEMENTIA: The dementia remaines stable and continues to function adequately in the current living environment with supervision.  The patient has had little changes in behavior. No complications noted from the medications presently being used. Patient does not follow commands.  CVA: The patient's CVA remains stable.  Staff deny new neurologic symptoms such as numbness, tingling, weakness, speech difficulties or visual disturbances.  No complications reported from the medications currently being used.  PAST MEDICAL HISTORY : Reviewed.  No changes.  CURRENT MEDICATIONS: Reviewed per [facility]  REVIEW OF SYSTEMS: Unobtainable secondary to dementia.  PHYSICAL EXAMINATION  VS:  T 97.7    P 62    RR 18    BP 104/64     WT (Lb) 137  GENERAL: no acute distress, thin body habitus EYES: conjunctivae normal, sclerae normal, normal eye lids NECK: supple, trachea midline, no thyroid tenderness, no thyromegaly LYMPHATICS: no LAN in the neck, no supraclavicular LAN RESPIRATORY: breathing is even & unlabored, BS CTAB CARDIAC: RRR, no murmur,no extra heart sounds, no edema GI: abdomen soft, normal BS, no masses, no tenderness, no hepatomegaly, no splenomegaly PSYCHIATRIC: the patient is alert & disoriented , affect & behavior appropriate.  Patient is somewhat agitated.  LABS/RADIOLOGY: 2-15 hemoglobin 10.9, MCV 92 otherwise CBC normal, BMP normal 12-14 hemoglobin 10.7, MCV 94 otherwise CBC normal, glucose 119 otherwise BMP normal  11-14 hemoglobin 11.7, MCV 94 otherwise CBC normal  10-14  hemoglobin 12.4, MCV 94 otherwise CBC normal, glucose 123 otherwise CMP normal. Repeat CBC: Hemoglobin 11.2 MCV 94 otherwise CBC normal  9-14 hemoglobin 12.3, MCV 92 otherwise CBC normal, glucose 129, albumin 3.4, alkaline phosphatase 137 otherwise CMP normal, HDL 30 otherwise fasting lipid panel normal, hemoglobin A1c 6.1, folate greater than 19.9, magnesium 1.7 3/14 hemoglobin 11.5, MCV 95 otherwise CBC normal, alkaline phosphatase 2:30, glucose 105, AST 69, ALT 334, fasting lipid panel normal, hemoglobin A1c 5.9, folate greater than 19.9, magnesium 2 9/13 glucose 125, alkaline phosphatase 139 otherwise CMP normal  ASSESSMENT/PLAN:  CVA-continue supportive care. seizure disorder-well-controlled. Dementia 292.82-advanced.  Hypomagnesimia-well repleted. Continue supplementation. GERD-stable Depression-on Paxil Anemia of chronic disease-stable  CPT CODE: 99833

## 2014-02-11 ENCOUNTER — Encounter (HOSPITAL_COMMUNITY): Payer: Self-pay | Admitting: Emergency Medicine

## 2014-02-11 ENCOUNTER — Emergency Department (HOSPITAL_COMMUNITY)
Admission: EM | Admit: 2014-02-11 | Discharge: 2014-02-12 | Disposition: A | Discharge status: Home / Self Care | Payer: Non-veteran care | Attending: Emergency Medicine | Admitting: Emergency Medicine

## 2014-02-11 DIAGNOSIS — K219 Gastro-esophageal reflux disease without esophagitis: Secondary | ICD-10-CM | POA: Insufficient documentation

## 2014-02-11 DIAGNOSIS — R143 Flatulence: Secondary | ICD-10-CM

## 2014-02-11 DIAGNOSIS — R142 Eructation: Secondary | ICD-10-CM | POA: Insufficient documentation

## 2014-02-11 DIAGNOSIS — F0390 Unspecified dementia without behavioral disturbance: Secondary | ICD-10-CM | POA: Insufficient documentation

## 2014-02-11 DIAGNOSIS — F29 Unspecified psychosis not due to a substance or known physiological condition: Secondary | ICD-10-CM | POA: Insufficient documentation

## 2014-02-11 DIAGNOSIS — Z7982 Long term (current) use of aspirin: Secondary | ICD-10-CM | POA: Insufficient documentation

## 2014-02-11 DIAGNOSIS — Z8673 Personal history of transient ischemic attack (TIA), and cerebral infarction without residual deficits: Secondary | ICD-10-CM | POA: Insufficient documentation

## 2014-02-11 DIAGNOSIS — R141 Gas pain: Secondary | ICD-10-CM | POA: Insufficient documentation

## 2014-02-11 DIAGNOSIS — Z79899 Other long term (current) drug therapy: Secondary | ICD-10-CM | POA: Insufficient documentation

## 2014-02-11 DIAGNOSIS — Z431 Encounter for attention to gastrostomy: Secondary | ICD-10-CM | POA: Insufficient documentation

## 2014-02-11 NOTE — ED Notes (Signed)
Pt comes from [facility]. Staff states around 915p pt feeding tube was out. Feeding tube at bedside. Pt denies pain and is alert to self.

## 2014-02-11 NOTE — ED Provider Notes (Signed)
CSN: [id number]     Arrival date & time 02/11/14  2222 History   First MD Initiated Contact with Patient 02/11/14 2224     Chief Complaint  Patient presents with  . pulled out feeding tube      (Consider location/radiation/quality/duration/timing/severity/associated sxs/prior Treatment) The history is provided by the nursing home and the EMS personnel.   patient was sent in to have his PEG tube replaced. Reportedly pulled out around 9:30 tonight. Patient has dementia and has no complaints. He will be minimally respond to me.  Past Medical History  Diagnosis Date  . GERD (gastroesophageal reflux disease)   . ETOH abuse   . CVA (cerebral vascular accident)   . Psychosis    History reviewed. No pertinent past surgical history. History reviewed. No pertinent family history. History  Substance Use Topics  . Smoking status: Never Smoker   . Smokeless tobacco: Not on file  . Alcohol Use: Not on file    Review of Systems  Unable to perform ROS: Dementia      Allergies  Fosinopril and Prazosin  Home Medications   Current Outpatient Rx  Name  Route  Sig  Dispense  Refill  . acetaminophen (TYLENOL) 325 MG tablet   PEG Tube   650 mg by PEG Tube route every 6 (six) hours as needed. Pain.         Marland Kitchen aspirin 81 MG chewable tablet   PEG Tube   81 mg by PEG Tube route daily.         . folic acid (FOLVITE) 1 MG tablet   PEG Tube   1 mg by PEG Tube route daily.         . lansoprazole (PREVACID SOLUTAB) 30 MG disintegrating tablet   PEG Tube   30 mg by PEG Tube route daily.         Marland Kitchen levETIRAcetam (KEPPRA) 100 MG/ML solution   PEG Tube   by PEG Tube route See admin instructions. Give 75ml (500mg ) via tube twice daily.         Marland Kitchen LORazepam (ATIVAN) 1 MG tablet      Administer one tablet via tube every 6 hours as needed for anxiety   120 tablet   5   . LORazepam (ATIVAN) 2 MG/ML injection   Intravenous   Inject 1 mg into the vein every 6 (six) hours as needed.  Seizure/agitation.         . magnesium oxide (MAG-OX 400) 400 (241.3 MG) MG tablet   PEG Tube   400 mg by PEG Tube route every 6 (six) hours.         . metoprolol tartrate (LOPRESSOR) 25 MG tablet   PEG Tube   25 mg by PEG Tube route 2 (two) times daily.         . Multiple Vitamin (MULITIVITAMIN) LIQD   PEG Tube   5 mLs by PEG Tube route daily.         . Nutritional Supplements (ISOSOURCE HN) LIQD   PEG Tube   1 Can by PEG Tube route every 4 (four) hours.          Marland Kitchen PRESCRIPTION MEDICATION   Topical   Apply 0.5 mg topically every 6 (six) hours as needed. Lorazepam 0.5mg /ml topical gel. Apply to wrist every 6 hours as needed for agitation.         . risperiDONE (RISPERDAL M-TABS) 2 MG disintegrating tablet   PEG Tube   2 mg by  PEG Tube route 2 (two) times daily.         Marland Kitchen thiamine (VITAMIN B-1) 100 MG tablet   PEG Tube   100 mg by PEG Tube route daily.          BP 122/97  Pulse 76  Temp(Src) 98.7 F (37.1 C) (Oral)  Resp 18  SpO2 96% Physical Exam  Constitutional: He appears well-developed.  Cardiovascular: Normal rate and regular rhythm.   Pulmonary/Chest: Effort normal and breath sounds normal.  Abdominal:  Stoma for that PEG tube in left upper quadrant. Mild abdominal distention.  Musculoskeletal: He exhibits no edema.  Skin: Skin is warm.    ED Course  Procedures (including critical care time) Labs Review Labs Reviewed - No data to display Imaging Review No results found.  EKG Interpretation   None       MDM   Final diagnoses:  None    Patient pulled out PEG tube. Unable to replace. Will set up replacement for tomorrow.     Jasper Riling. Alvino Chapel, MD 02/12/14 4401

## 2014-02-11 NOTE — ED Notes (Signed)
Bed: TM93 Expected date:  Expected time:  Means of arrival:  Comments: EMS/from [facility]-pulled out feeding tube

## 2014-02-12 ENCOUNTER — Ambulatory Visit (HOSPITAL_COMMUNITY)
Admission: RE | Admit: 2014-02-12 | Discharge: 2014-02-12 | Disposition: A | Discharge status: Home / Self Care | Payer: Non-veteran care | Source: Ambulatory Visit | Attending: Emergency Medicine | Admitting: Emergency Medicine

## 2014-02-12 DIAGNOSIS — Z431 Encounter for attention to gastrostomy: Secondary | ICD-10-CM | POA: Insufficient documentation

## 2014-02-12 MED ORDER — IOHEXOL 300 MG/ML  SOLN
50.0000 mL | Freq: Once | INTRAMUSCULAR | Status: AC | PRN
  Administered 2014-02-12: 20 mL

## 2014-02-12 NOTE — Discharge Instructions (Signed)
We were unable to replace the gtube in the ER tonight.  He will need to return to [facility] Radiology Department to have this procedure completed.  You should be called in the morning to have this scheduled.   Gastric Tube Replacement You are having your gastric tube (the tube that goes into the stomach) changed. This is usually a very minor procedure. If medications are prescribed, take them as directed. Only take over-the-counter or prescription medications for pain, discomfort, or fever as directed by your caregiver.  SEEK IMMEDIATE MEDICAL CARE IF:   You develop chills, fever, or show signs of generalized illness.  You develop bleeding around the tube.  Your new tube does not seem to be working properly.  You are unable to get feedings into the tube.  Your tube comes out for any reason. Document Released: 09/05/2001 Document Revised: 03/04/2012 Document Reviewed: 12/11/2005 [facility] Patient Information 2014 [place].

## 2014-02-12 NOTE — Procedures (Signed)
Successful replacement of 18Fr balloon retention gastrostomy tube. No complications.  Ascencion Dike PA-C Interventional Radiology 02/12/2014 12:09 PM

## 2014-02-12 NOTE — ED Provider Notes (Signed)
Care assumed from Dr Alvino Chapel.  Unable to place feeding tube at this time.  Will schedule patient for replacement in am.  Kalman Drape, MD 02/12/14 [phone]

## 2014-03-25 ENCOUNTER — Other Ambulatory Visit: Payer: Self-pay | Admitting: *Deleted

## 2014-03-25 MED ORDER — LORAZEPAM 1 MG PO TABS: ORAL_TABLET | ORAL | Status: DC

## 2014-03-25 NOTE — Telephone Encounter (Signed)
[name] 

## 2014-05-13 ENCOUNTER — Non-Acute Institutional Stay (SKILLED_NURSING_FACILITY): Payer: PRIVATE HEALTH INSURANCE | Admitting: Internal Medicine

## 2014-05-13 DIAGNOSIS — I6990 Unspecified sequelae of unspecified cerebrovascular disease: Secondary | ICD-10-CM

## 2014-05-13 DIAGNOSIS — R569 Unspecified convulsions: Secondary | ICD-10-CM

## 2014-05-13 DIAGNOSIS — F1997 Other psychoactive substance use, unspecified with psychoactive substance-induced persisting dementia: Secondary | ICD-10-CM

## 2014-05-14 NOTE — Progress Notes (Signed)
         PROGRESS NOTE  DATE: 05-13-14  FACILITY: [facility]  LEVEL OF CARE: SNF  Routine Visit  CHIEF COMPLAINT:  Manage CVA, dementia and seizure disorder  HISTORY OF PRESENT ILLNESS:  REASSESSMENT OF ONGOING PROBLEM(S):  SEIZURE DISORDER: The patient's seizure disorder remains stable. No complications reported from the medications presently being used. Staff do not report any recent seizure activity.  DEMENTIA: The dementia remaines stable and continues to function adequately in the current living environment with supervision.  The patient has had little changes in behavior. No complications noted from the medications presently being used. Patient does not follow commands.  CVA: The patient's CVA remains stable.  Staff deny new neurologic symptoms such as numbness, tingling, weakness, speech difficulties or visual disturbances.  No complications reported from the medications currently being used.  PAST MEDICAL HISTORY : Reviewed.  No changes.  CURRENT MEDICATIONS: Reviewed per [facility]  REVIEW OF SYSTEMS: Unobtainable secondary to dementia.  PHYSICAL EXAMINATION  VS:  see vital signs section  GENERAL: no acute distress, thin body habitus EYES: conjunctivae normal, sclerae normal, normal eye lids NECK: supple, trachea midline, no thyroid tenderness, no thyromegaly LYMPHATICS: no LAN in the neck, no supraclavicular LAN RESPIRATORY: breathing is even & unlabored, BS CTAB CARDIAC: RRR, no murmur,no extra heart sounds, no edema GI: abdomen soft, normal BS, no masses, no tenderness, no hepatomegaly, no splenomegaly PSYCHIATRIC: the patient is alert & disoriented , affect & behavior appropriate.  Patient is somewhat agitated.  LABS/RADIOLOGY: 3-15 hemoglobin 11.1, MCV 92 otherwise CBC normal, and 3.5 otherwise CMP normal, HDL 37 otherwise fasting lipid panel normal, hemoglobin A1c 5.7, folate greater than 19.9, magnesium 1.8 2-15 hemoglobin 10.9, MCV 92 otherwise CBC normal, BMP  normal 12-14 hemoglobin 10.7, MCV 94 otherwise CBC normal, glucose 119 otherwise BMP normal  11-14 hemoglobin 11.7, MCV 94 otherwise CBC normal  10-14 hemoglobin 12.4, MCV 94 otherwise CBC normal, glucose 123 otherwise CMP normal. Repeat CBC: Hemoglobin 11.2 MCV 94 otherwise CBC normal  9-14 hemoglobin 12.3, MCV 92 otherwise CBC normal, glucose 129, albumin 3.4, alkaline phosphatase 137 otherwise CMP normal, HDL 30 otherwise fasting lipid panel normal, hemoglobin A1c 6.1, folate greater than 19.9, magnesium 1.7 3/14 hemoglobin 11.5, MCV 95 otherwise CBC normal, alkaline phosphatase 2:30, glucose 105, AST 69, ALT 334, fasting lipid panel normal, hemoglobin A1c 5.9, folate greater than 19.9, magnesium 2 9/13 glucose 125, alkaline phosphatase 139 otherwise CMP normal  ASSESSMENT/PLAN:  CVA-continue supportive care. seizure disorder-well-controlled. Dementia 292.82-advanced.  Hypomagnesimia-well repleted. Continue supplementation. GERD-stable Depression-on Paxil Anemia of chronic disease-stable  CPT CODE: 70350  [name] [name]. [name] [name], [place] [phone]

## 2014-06-01 ENCOUNTER — Non-Acute Institutional Stay (SKILLED_NURSING_FACILITY): Payer: PRIVATE HEALTH INSURANCE | Admitting: Internal Medicine

## 2014-06-01 DIAGNOSIS — I6990 Unspecified sequelae of unspecified cerebrovascular disease: Secondary | ICD-10-CM

## 2014-06-01 DIAGNOSIS — R569 Unspecified convulsions: Secondary | ICD-10-CM

## 2014-06-01 DIAGNOSIS — F1997 Other psychoactive substance use, unspecified with psychoactive substance-induced persisting dementia: Secondary | ICD-10-CM

## 2014-06-01 NOTE — Progress Notes (Signed)
                PROGRESS NOTE  DATE: 06-01-14  FACILITY: [facility]  LEVEL OF CARE: SNF  Routine Visit  CHIEF COMPLAINT:  Manage CVA, dementia and seizure disorder  HISTORY OF PRESENT ILLNESS:  REASSESSMENT OF ONGOING PROBLEM(S):  SEIZURE DISORDER: The patient's seizure disorder remains stable. No complications reported from the medications presently being used. Staff do not report any recent seizure activity.  DEMENTIA: The dementia remaines stable and continues to function adequately in the current living environment with supervision.  The patient has had little changes in behavior. No complications noted from the medications presently being used. Patient does not follow commands.  CVA: The patient's CVA remains stable.  Staff deny new neurologic symptoms such as numbness, tingling, weakness, speech difficulties or visual disturbances.  No complications reported from the medications currently being used.  PAST MEDICAL HISTORY : Reviewed.  No changes.  CURRENT MEDICATIONS: Reviewed per [facility]  REVIEW OF SYSTEMS: Unobtainable secondary to dementia.  PHYSICAL EXAMINATION  VS:  see vital signs section  GENERAL: no acute distress, thin body habitus EYES: conjunctivae normal, sclerae normal, normal eye lids NECK: supple, trachea midline, no thyroid tenderness, no thyromegaly LYMPHATICS: no LAN in the neck, no supraclavicular LAN RESPIRATORY: breathing is even & unlabored, BS CTAB CARDIAC: RRR, no murmur,no extra heart sounds, no edema GI: abdomen soft, normal BS, no masses, no tenderness, no hepatomegaly, no splenomegaly PSYCHIATRIC: the patient is alert & disoriented , affect & behavior appropriate.  Patient is somewhat agitated.  LABS/RADIOLOGY: 3-15 hemoglobin 11.1, MCV 92 otherwise CBC normal, and 3.5 otherwise CMP normal, HDL 37 otherwise fasting lipid panel normal, hemoglobin A1c 5.7, folate greater than 19.9, magnesium 1.8 2-15 hemoglobin 10.9, MCV 92 otherwise CBC  normal, BMP normal 12-14 hemoglobin 10.7, MCV 94 otherwise CBC normal, glucose 119 otherwise BMP normal  11-14 hemoglobin 11.7, MCV 94 otherwise CBC normal  10-14 hemoglobin 12.4, MCV 94 otherwise CBC normal, glucose 123 otherwise CMP normal. Repeat CBC: Hemoglobin 11.2 MCV 94 otherwise CBC normal  9-14 hemoglobin 12.3, MCV 92 otherwise CBC normal, glucose 129, albumin 3.4, alkaline phosphatase 137 otherwise CMP normal, HDL 30 otherwise fasting lipid panel normal, hemoglobin A1c 6.1, folate greater than 19.9, magnesium 1.7 3/14 hemoglobin 11.5, MCV 95 otherwise CBC normal, alkaline phosphatase 2:30, glucose 105, AST 69, ALT 334, fasting lipid panel normal, hemoglobin A1c 5.9, folate greater than 19.9, magnesium 2 9/13 glucose 125, alkaline phosphatase 139 otherwise CMP normal  ASSESSMENT/PLAN:  CVA-continue supportive care. seizure disorder-well-controlled. Dementia 292.82-advanced. Depakote was increased Hypomagnesimia-well repleted. Continue supplementation. GERD-stable Depression-Paxil was decreased Anemia of chronic disease-stable Check Depakote level  CPT CODE: 73419  Edgar Frisk. Durwin Reges, [place] [phone]

## 2014-06-09 ENCOUNTER — Other Ambulatory Visit: Payer: Self-pay | Admitting: Internal Medicine

## 2014-06-09 ENCOUNTER — Ambulatory Visit (HOSPITAL_COMMUNITY)
Admission: RE | Admit: 2014-06-09 | Discharge: 2014-06-09 | Disposition: A | Discharge status: Home / Self Care | Payer: Non-veteran care | Source: Ambulatory Visit | Attending: Internal Medicine | Admitting: Internal Medicine

## 2014-06-09 DIAGNOSIS — K3189 Other diseases of stomach and duodenum: Secondary | ICD-10-CM

## 2014-06-09 DIAGNOSIS — K9423 Gastrostomy malfunction: Secondary | ICD-10-CM | POA: Insufficient documentation

## 2014-06-09 MED ORDER — IOHEXOL 300 MG/ML  SOLN
50.0000 mL | Freq: Once | INTRAMUSCULAR | Status: AC | PRN
  Administered 2014-06-09: 10 mL

## 2014-06-29 ENCOUNTER — Other Ambulatory Visit (HOSPITAL_COMMUNITY): Payer: Self-pay | Admitting: Interventional Radiology

## 2014-06-29 ENCOUNTER — Ambulatory Visit (HOSPITAL_COMMUNITY)
Admission: RE | Admit: 2014-06-29 | Discharge: 2014-06-29 | Disposition: A | Discharge status: Home / Self Care | Payer: Non-veteran care | Source: Ambulatory Visit | Attending: Interventional Radiology | Admitting: Interventional Radiology

## 2014-06-29 DIAGNOSIS — R1310 Dysphagia, unspecified: Secondary | ICD-10-CM | POA: Insufficient documentation

## 2014-06-29 MED ORDER — IOHEXOL 300 MG/ML  SOLN
50.0000 mL | Freq: Once | INTRAMUSCULAR | Status: AC | PRN
Start: 2014-06-29 — End: 2014-06-29
  Administered 2014-06-29: 15 mL

## 2014-06-29 NOTE — Progress Notes (Signed)
Patient arrived with [facility] from [facility]. Not wearing an armband. E. Honeycut with [facility] verified with me that this is [name] [name] DOB [birth year]. Arm band applied.

## 2014-07-02 ENCOUNTER — Non-Acute Institutional Stay (SKILLED_NURSING_FACILITY): Payer: PRIVATE HEALTH INSURANCE | Admitting: Internal Medicine

## 2014-07-02 DIAGNOSIS — F1997 Other psychoactive substance use, unspecified with psychoactive substance-induced persisting dementia: Secondary | ICD-10-CM

## 2014-07-02 DIAGNOSIS — I6990 Unspecified sequelae of unspecified cerebrovascular disease: Secondary | ICD-10-CM

## 2014-07-02 DIAGNOSIS — R569 Unspecified convulsions: Secondary | ICD-10-CM

## 2014-07-02 NOTE — Progress Notes (Signed)
              PROGRESS NOTE  DATE: 07-02-14  FACILITY: [facility]  LEVEL OF CARE: SNF  Routine Visit  CHIEF COMPLAINT:  Manage CVA, dementia and seizure disorder  HISTORY OF PRESENT ILLNESS:  REASSESSMENT OF ONGOING PROBLEM(S):  SEIZURE DISORDER: The patient's seizure disorder remains stable. No complications reported from the medications presently being used. Staff do not report any recent seizure activity.  DEMENTIA: The dementia remaines stable and continues to function adequately in the current living environment with supervision.  The patient has had little changes in behavior. No complications noted from the medications presently being used. Patient does not follow commands.  CVA: The patient's CVA remains stable.  Staff deny new neurologic symptoms such as numbness, tingling, weakness, speech difficulties or visual disturbances.  No complications reported from the medications currently being used.  PAST MEDICAL HISTORY : Reviewed.  No changes.  CURRENT MEDICATIONS: Reviewed per [facility]  REVIEW OF SYSTEMS: Unobtainable secondary to dementia.  PHYSICAL EXAMINATION  VS:  see vital signs section  GENERAL: no acute distress, thin body habitus EYES: conjunctivae normal, sclerae normal, normal eye lids NECK: supple, trachea midline, no thyroid tenderness, no thyromegaly LYMPHATICS: no LAN in the neck, no supraclavicular LAN RESPIRATORY: breathing is even & unlabored, BS CTAB CARDIAC: RRR, no murmur,no extra heart sounds, no edema GI: abdomen soft, normal BS, no masses, no tenderness, no hepatomegaly, no splenomegaly PSYCHIATRIC: the patient is alert & disoriented , affect & behavior appropriate.  Patient is somewhat agitated.  LABS/RADIOLOGY: 6-15 Depakote level 16 3-15 hemoglobin 11.1, MCV 92 otherwise CBC normal, and 3.5 otherwise CMP normal, HDL 37 otherwise fasting lipid panel normal, hemoglobin A1c 5.7, folate greater than 19.9, magnesium 1.8 2-15 hemoglobin 10.9, MCV 92  otherwise CBC normal, BMP normal 12-14 hemoglobin 10.7, MCV 94 otherwise CBC normal, glucose 119 otherwise BMP normal  11-14 hemoglobin 11.7, MCV 94 otherwise CBC normal  10-14 hemoglobin 12.4, MCV 94 otherwise CBC normal, glucose 123 otherwise CMP normal. Repeat CBC: Hemoglobin 11.2 MCV 94 otherwise CBC normal  9-14 hemoglobin 12.3, MCV 92 otherwise CBC normal, glucose 129, albumin 3.4, alkaline phosphatase 137 otherwise CMP normal, HDL 30 otherwise fasting lipid panel normal, hemoglobin A1c 6.1, folate greater than 19.9, magnesium 1.7 3/14 hemoglobin 11.5, MCV 95 otherwise CBC normal, alkaline phosphatase 2:30, glucose 105, AST 69, ALT 334, fasting lipid panel normal, hemoglobin A1c 5.9, folate greater than 19.9, magnesium 2 9/13 glucose 125, alkaline phosphatase 139 otherwise CMP normal  ASSESSMENT/PLAN:  CVA-continue supportive care. seizure disorder-well-controlled. Dementia 292.82-advanced.  Hypomagnesimia-well repleted. Continue supplementation. GERD-stable Depression-on Paxil Anemia of chronic disease-stable  CPT CODE: 28315  Edgar Frisk. Durwin Reges, [place] [phone]

## 2014-07-10 ENCOUNTER — Non-Acute Institutional Stay (SKILLED_NURSING_FACILITY): Payer: PRIVATE HEALTH INSURANCE | Admitting: Internal Medicine

## 2014-07-10 DIAGNOSIS — L03319 Cellulitis of trunk, unspecified: Principal | ICD-10-CM

## 2014-07-10 DIAGNOSIS — L02219 Cutaneous abscess of trunk, unspecified: Secondary | ICD-10-CM

## 2014-07-14 NOTE — Progress Notes (Signed)
Patient ID: [name] [name], male   DOB: 02-14-1946, 68 y.o.   MRN: [id number]               PROGRESS NOTE  DATE:  07/10/2014    FACILITY: [facility]    LEVEL OF CARE:   SNF   Acute Visit   CHIEF COMPLAINT:  ?Sebaceous cyst on his back.    HISTORY OF PRESENT ILLNESS:  This is a very disabled man who is chronically PEG tube dependent.  It appears that he has a history of an occipital and cerebellar CVA.    According to the wound care nurse today, he has a history of sebaceous cysts including one large one on his forehead in the past.  The current area is over his thoracic spine at probably the T4-5 level.  This has already opened and drained, and I have been asked to look at this.    REVIEW OF SYSTEMS:  Really not possible from the patient today.    PHYSICAL EXAMINATION:   GENERAL APPEARANCE:  The patient is afebrile and  apparently is not systemically ill.   SKIN:  INSPECTION:  Apparently a substantial area, perhaps 3 in x 2 in over the mid part of his spinal cord at a T4-5 level.  This is already draining a serosanguineous drainage.  This is probably somewhat tender, although it is difficult to tell.    The area was anesthetized with 1% lidocaine.  The area was opened with a #15 scalpel blade.  Again, there is serosanguineous drainage here.  This does not look to be a sebaceous cyst.    ASSESSMENT/PLAN:  Skin abscess.  I suspect this is probably a Staph infection.  Unfortunately, we do not have an up-to-date culture swab.  I have put him on Septra DS.  The area is to be cleaned daily and dressed with a dry dressing and allowed to drain.  I will update this next week.

## 2014-07-30 ENCOUNTER — Non-Acute Institutional Stay (SKILLED_NURSING_FACILITY): Payer: PRIVATE HEALTH INSURANCE | Admitting: Internal Medicine

## 2014-07-30 DIAGNOSIS — R569 Unspecified convulsions: Secondary | ICD-10-CM

## 2014-07-30 DIAGNOSIS — I6990 Unspecified sequelae of unspecified cerebrovascular disease: Secondary | ICD-10-CM

## 2014-07-30 DIAGNOSIS — F1997 Other psychoactive substance use, unspecified with psychoactive substance-induced persisting dementia: Secondary | ICD-10-CM

## 2014-08-01 NOTE — Progress Notes (Signed)
              PROGRESS NOTE  DATE: 07-30-14  FACILITY: [facility]  LEVEL OF CARE: SNF  Routine Visit  CHIEF COMPLAINT:  Manage CVA, dementia and seizure disorder  HISTORY OF PRESENT ILLNESS:  REASSESSMENT OF ONGOING PROBLEM(S):  SEIZURE DISORDER: The patient's seizure disorder remains stable. No complications reported from the medications presently being used. Staff do not report any recent seizure activity.  DEMENTIA: The dementia remaines stable and continues to function adequately in the current living environment with supervision.  The patient has had little changes in behavior. No complications noted from the medications presently being used. Patient does not follow commands.  CVA: The patient's CVA remains stable.  Staff deny new neurologic symptoms such as numbness, tingling, weakness, speech difficulties or visual disturbances.  No complications reported from the medications currently being used.  PAST MEDICAL HISTORY : Reviewed.  No changes.  CURRENT MEDICATIONS: Reviewed per [facility]  REVIEW OF SYSTEMS: Unobtainable secondary to dementia.  PHYSICAL EXAMINATION  VS:  see vital signs section  GENERAL: no acute distress, thin body habitus EYES: conjunctivae normal, sclerae normal, normal eye lids NECK: supple, trachea midline, no thyroid tenderness, no thyromegaly LYMPHATICS: no LAN in the neck, no supraclavicular LAN RESPIRATORY: breathing is even & unlabored, BS CTAB CARDIAC: RRR, no murmur,no extra heart sounds, no edema GI: abdomen soft, normal BS, no masses, no tenderness, no hepatomegaly, no splenomegaly PSYCHIATRIC: the patient is alert & disoriented , affect & behavior appropriate.  Patient is somewhat agitated.  LABS/RADIOLOGY: 6-15 Depakote level 16 3-15 hemoglobin 11.1, MCV 92 otherwise CBC normal, and 3.5 otherwise CMP normal, HDL 37 otherwise fasting lipid panel normal, hemoglobin A1c 5.7, folate greater than 19.9, magnesium 1.8 2-15 hemoglobin 10.9, MCV 92  otherwise CBC normal, BMP normal 12-14 hemoglobin 10.7, MCV 94 otherwise CBC normal, glucose 119 otherwise BMP normal  11-14 hemoglobin 11.7, MCV 94 otherwise CBC normal  10-14 hemoglobin 12.4, MCV 94 otherwise CBC normal, glucose 123 otherwise CMP normal. Repeat CBC: Hemoglobin 11.2 MCV 94 otherwise CBC normal  9-14 hemoglobin 12.3, MCV 92 otherwise CBC normal, glucose 129, albumin 3.4, alkaline phosphatase 137 otherwise CMP normal, HDL 30 otherwise fasting lipid panel normal, hemoglobin A1c 6.1, folate greater than 19.9, magnesium 1.7 3/14 hemoglobin 11.5, MCV 95 otherwise CBC normal, alkaline phosphatase 2:30, glucose 105, AST 69, ALT 334, fasting lipid panel normal, hemoglobin A1c 5.9, folate greater than 19.9, magnesium 2 9/13 glucose 125, alkaline phosphatase 139 otherwise CMP normal  ASSESSMENT/PLAN:  CVA-continue supportive care. seizure disorder-well-controlled. Dementia 292.82-advanced.  Hypomagnesimia-well repleted. Continue supplementation. GERD-stable Depression-on Paxil Anemia of chronic disease-stable Hypotension-not on antihypertensives. We'll review a BP log  CPT CODE: 34193  Edgar Frisk. Durwin Reges, [place] [phone]

## 2014-08-26 ENCOUNTER — Non-Acute Institutional Stay (SKILLED_NURSING_FACILITY): Payer: PRIVATE HEALTH INSURANCE | Admitting: Internal Medicine

## 2014-08-26 DIAGNOSIS — F1997 Other psychoactive substance use, unspecified with psychoactive substance-induced persisting dementia: Secondary | ICD-10-CM

## 2014-08-26 DIAGNOSIS — R569 Unspecified convulsions: Secondary | ICD-10-CM

## 2014-08-26 DIAGNOSIS — I6990 Unspecified sequelae of unspecified cerebrovascular disease: Secondary | ICD-10-CM

## 2014-08-28 NOTE — Progress Notes (Signed)
              PROGRESS NOTE  DATE: 08-26-14  FACILITY: [facility]  LEVEL OF CARE: SNF  Routine Visit  CHIEF COMPLAINT:  Manage CVA, dementia and seizure disorder  HISTORY OF PRESENT ILLNESS:  REASSESSMENT OF ONGOING PROBLEM(S):  SEIZURE DISORDER: The patient's seizure disorder remains stable. No complications reported from the medications presently being used. Staff do not report any recent seizure activity.  DEMENTIA: The dementia remaines stable and continues to function adequately in the current living environment with supervision.  The patient has had little changes in behavior. No complications noted from the medications presently being used. Patient does not follow commands.  CVA: The patient's CVA remains stable.  Staff deny new neurologic symptoms such as numbness, tingling, weakness, speech difficulties or visual disturbances.  No complications reported from the medications currently being used.  PAST MEDICAL HISTORY : Reviewed.  No changes.  CURRENT MEDICATIONS: Reviewed per [facility]  REVIEW OF SYSTEMS: Unobtainable secondary to dementia.  PHYSICAL EXAMINATION  VS:  see vital signs section  GENERAL: no acute distress, thin body habitus EYES: conjunctivae normal, sclerae normal, normal eye lids NECK: supple, trachea midline, no thyroid tenderness, no thyromegaly LYMPHATICS: no LAN in the neck, no supraclavicular LAN RESPIRATORY: breathing is even & unlabored, BS CTAB CARDIAC: RRR, no murmur,no extra heart sounds, no edema GI: abdomen soft, normal BS, no masses, no tenderness, no hepatomegaly, no splenomegaly PSYCHIATRIC: the patient is alert & disoriented , affect & behavior appropriate.  Patient is somewhat agitated.  LABS/RADIOLOGY: 6-15 Depakote level 16 3-15 hemoglobin 11.1, MCV 92 otherwise CBC normal, and 3.5 otherwise CMP normal, HDL 37 otherwise fasting lipid panel normal, hemoglobin A1c 5.7, folate greater than 19.9, magnesium 1.8 2-15 hemoglobin 10.9, MCV 92  otherwise CBC normal, BMP normal 12-14 hemoglobin 10.7, MCV 94 otherwise CBC normal, glucose 119 otherwise BMP normal  11-14 hemoglobin 11.7, MCV 94 otherwise CBC normal  10-14 hemoglobin 12.4, MCV 94 otherwise CBC normal, glucose 123 otherwise CMP normal. Repeat CBC: Hemoglobin 11.2 MCV 94 otherwise CBC normal  9-14 hemoglobin 12.3, MCV 92 otherwise CBC normal, glucose 129, albumin 3.4, alkaline phosphatase 137 otherwise CMP normal, HDL 30 otherwise fasting lipid panel normal, hemoglobin A1c 6.1, folate greater than 19.9, magnesium 1.7 3/14 hemoglobin 11.5, MCV 95 otherwise CBC normal, alkaline phosphatase 2:30, glucose 105, AST 69, ALT 334, fasting lipid panel normal, hemoglobin A1c 5.9, folate greater than 19.9, magnesium 2 9/13 glucose 125, alkaline phosphatase 139 otherwise CMP normal  ASSESSMENT/PLAN:  CVA-continue supportive care. seizure disorder-well-controlled. Dementia 292.82-advanced.  Hypomagnesimia-well repleted. Continue supplementation. GERD-stable Depression-on Paxil Anemia of chronic disease-stable  CPT CODE: 17616  Edgar Frisk. Durwin Reges, [place] [phone]

## 2014-09-02 ENCOUNTER — Non-Acute Institutional Stay (SKILLED_NURSING_FACILITY): Payer: PRIVATE HEALTH INSURANCE | Admitting: Internal Medicine

## 2014-09-02 DIAGNOSIS — N390 Urinary tract infection, site not specified: Secondary | ICD-10-CM

## 2014-09-08 ENCOUNTER — Non-Acute Institutional Stay (SKILLED_NURSING_FACILITY): Payer: PRIVATE HEALTH INSURANCE | Admitting: Internal Medicine

## 2014-09-08 DIAGNOSIS — L723 Sebaceous cyst: Secondary | ICD-10-CM

## 2014-09-09 NOTE — Progress Notes (Signed)
Patient ID: [name] [name], male   DOB: 16-Jun-1946, 68 y.o.   MRN: [id number]           PROGRESS NOTE  DATE: 09/02/2014           FACILITY:  [facility]  LEVEL OF CARE: SNF (31)  Acute Visit  CHIEF COMPLAINT:  Manage fever.    HISTORY OF PRESENT ILLNESS: I was requested by the staff to assess the patient regarding above problem(s):  Due to fever, patient had a fever work-up done on 08/29/2014.  The urinalysis shows cloudy appearance, 3+ WBC esterase, urine nitrite positive, and WBCs 11-30.  Urine culture is growing Staphylococcus aureus significantly.  Patient does not follow commands due to dementia.    PAST MEDICAL HISTORY : Reviewed.  No changes/see problem list  CURRENT MEDICATIONS: Reviewed per [facility]/see medication list  REVIEW OF SYSTEMS:  Unobtainable due to dementia.          PHYSICAL EXAMINATION  VS: see VS section  GENERAL: no acute distress, normal body habitus NECK: supple, trachea midline, no neck masses, no thyroid tenderness, no thyromegaly RESPIRATORY: breathing is even & unlabored, BS CTAB CARDIAC: RRR, no murmur,no extra heart sounds, no edema GI: abdomen soft, normal BS, no masses, no tenderness, no hepatomegaly, no splenomegaly, patient has a PEG          PSYCHIATRIC: the patient is alert, disoriented, affect & behavior appropriate  ASSESSMENT/PLAN:  UTI.  New problem.  Start Bactrim double-strength, 1 tablet b.i.d. for seven days and probiotics b.i.d. for 10 days.    CPT CODE: 62694           Jackalyn Haith Y [employer], [place] [phone]

## 2014-09-14 NOTE — Progress Notes (Signed)
Patient ID: [name] [name], male   DOB: November 12, 1946, 68 y.o.   MRN: [id number]               PROGRESS NOTE  DATE:  09/08/2014      [facility]ACILITY[facility]    LEVEL OF CARE:   SNF   Acute Visit   CHIEF COMPLAINT:  Right axilla lesion.    HISTORY OF PRESENT ILLNESS:  This is a very disabled man who is chronically PEG tube dependent as the consequence of prior multiple CVAs.     He has a history of sebaceous cysts, including one over his forehead in the past and then one over his thoracic spine.  He was noted to have a raised area in his right axilla.  I was asked to look at this.     PHYSICAL EXAMINATION:   LYMPHATICS:   Right axilla:  There is a superficial (skin) area in the right axilla.  This is raised, roughly 3/4 in x 1/2 in.  There are two open areas which had apparently been draining.  I could not express anything out of this.  The area was cleaned and opened with a #15 scalpel as it is relatively small.  I did not anesthetize this.  He tolerated this fairly well.     ASSESSMENT/PLAN:  Cyst, right axilla.  He has a history of ?sebaceous cysts.  I am going to dress this with topical antibiotics and a dry dressing and see how this does.   A C&S was done.  I am not going to start him on systemic antibiotics at this point.          CPT CODE: 94076

## 2014-09-30 ENCOUNTER — Emergency Department (HOSPITAL_COMMUNITY): Payer: Non-veteran care

## 2014-09-30 ENCOUNTER — Non-Acute Institutional Stay (SKILLED_NURSING_FACILITY): Payer: PRIVATE HEALTH INSURANCE | Admitting: Internal Medicine

## 2014-09-30 ENCOUNTER — Encounter (HOSPITAL_COMMUNITY): Payer: Self-pay | Admitting: Emergency Medicine

## 2014-09-30 ENCOUNTER — Emergency Department (HOSPITAL_COMMUNITY)
Admission: EM | Admit: 2014-09-30 | Discharge: 2014-09-30 | Disposition: A | Discharge status: Home / Self Care | Payer: Non-veteran care | Attending: Emergency Medicine | Admitting: Emergency Medicine

## 2014-09-30 DIAGNOSIS — Z79899 Other long term (current) drug therapy: Secondary | ICD-10-CM | POA: Insufficient documentation

## 2014-09-30 DIAGNOSIS — Z792 Long term (current) use of antibiotics: Secondary | ICD-10-CM | POA: Insufficient documentation

## 2014-09-30 DIAGNOSIS — N390 Urinary tract infection, site not specified: Secondary | ICD-10-CM | POA: Insufficient documentation

## 2014-09-30 DIAGNOSIS — F0390 Unspecified dementia without behavioral disturbance: Secondary | ICD-10-CM | POA: Insufficient documentation

## 2014-09-30 DIAGNOSIS — Z8673 Personal history of transient ischemic attack (TIA), and cerebral infarction without residual deficits: Secondary | ICD-10-CM | POA: Insufficient documentation

## 2014-09-30 DIAGNOSIS — R634 Abnormal weight loss: Secondary | ICD-10-CM

## 2014-09-30 DIAGNOSIS — R05 Cough: Secondary | ICD-10-CM | POA: Insufficient documentation

## 2014-09-30 DIAGNOSIS — Z7982 Long term (current) use of aspirin: Secondary | ICD-10-CM | POA: Insufficient documentation

## 2014-09-30 DIAGNOSIS — K219 Gastro-esophageal reflux disease without esophagitis: Secondary | ICD-10-CM | POA: Insufficient documentation

## 2014-09-30 LAB — BASIC METABOLIC PANEL
Anion gap: 12 (ref 5–15)
BUN: 14 mg/dL (ref 6–23)
CO2: 28 meq/L (ref 19–32)
Calcium: 8.9 mg/dL (ref 8.4–10.5)
Chloride: 94 mEq/L — ABNORMAL LOW (ref 96–112)
Creatinine, Ser: 0.67 mg/dL (ref 0.50–1.35)
GFR calc Af Amer: >90 mL/min (ref >90)
Glucose, Bld: 112 mg/dL — ABNORMAL HIGH (ref 70–99)
Potassium: 4.1 mEq/L (ref 3.7–5.3)
Sodium: 134 mEq/L — ABNORMAL LOW (ref 137–147)

## 2014-09-30 LAB — URINE MICROSCOPIC-ADD ON

## 2014-09-30 LAB — URINALYSIS, ROUTINE W REFLEX MICROSCOPIC
Bilirubin Urine: NEGATIVE
GLUCOSE, UA: NEGATIVE mg/dL
KETONES UR: NEGATIVE mg/dL
Nitrite: NEGATIVE
PROTEIN: NEGATIVE mg/dL
Specific Gravity, Urine: 1.009 (ref 1.005–1.030)
Urobilinogen, UA: 1 mg/dL (ref 0.0–1.0)
pH: 8 (ref 5.0–8.0)

## 2014-09-30 LAB — CBC
HCT: 32.2 % — ABNORMAL LOW (ref 39.0–52.0)
Hemoglobin: 11.3 g/dL — ABNORMAL LOW (ref 13.0–17.0)
MCH: 31.6 pg (ref 26.0–34.0)
MCHC: 35.1 g/dL (ref 30.0–36.0)
MCV: 89.9 fL (ref 78.0–100.0)
Platelets: 225 10*3/uL (ref 150–400)
RBC: 3.58 MIL/uL — AB (ref 4.22–5.81)
RDW: 12.7 % (ref 11.5–15.5)
WBC: 5 10*3/uL (ref 4.0–10.5)

## 2014-09-30 LAB — APTT: aPTT: 33 seconds (ref 24–37)

## 2014-09-30 LAB — CK TOTAL AND CKMB (NOT AT ARMC)
CK, MB: <1 ng/mL (ref 0.3–4.0)
Total CK: 39 U/L (ref 7–232)

## 2014-09-30 LAB — PROTIME-INR
INR: 1.03 (ref 0.00–1.49)
Prothrombin Time: 13.5 seconds (ref 11.6–15.2)

## 2014-09-30 LAB — I-STAT TROPONIN, ED: TROPONIN I, POC: 0 ng/mL (ref 0.00–0.08)

## 2014-09-30 MED ORDER — LEVOFLOXACIN 500 MG PO TABS: 500.0000 mg | ORAL_TABLET | Freq: Every day | ORAL | Status: DC

## 2014-09-30 NOTE — ED Notes (Signed)
Pt arrives via EMS from [facility], c/o pain in chest and "all over." Neurologically at baseline. Central chest pain, "Like someone is sitting on him", moves into back and neck, present all day. Presently spitting at ED staff. 100.2 temp PTA, feels hot to the touch. 22G IV placed in L hand.

## 2014-09-30 NOTE — Discharge Instructions (Signed)

## 2014-09-30 NOTE — ED Provider Notes (Signed)
CSN: [id number]     Arrival date & time 09/30/14  2119 History   First MD Initiated Contact with Patient 09/30/14 2126     Chief Complaint  Patient presents with  . Chest Pain     (Consider location/radiation/quality/duration/timing/severity/associated sxs/prior Treatment) HPI  Level V caveat- Uncooperative and dementia Code Status: Unknown.  Patient to the ER by EMS from [facility] for unclear reasons. The patient has history of CVA with dementia, ETOH abuse, psychosis and GERD. The facility report that he is complaining of chest pains. On arrival the patient is agitated and spitting at staff. He cusses at me and refuses to answer questions. Tells me he feels weak. Otherwise, the patient is awake and alert  Past Medical History  Diagnosis Date  . GERD (gastroesophageal reflux disease)   . ETOH abuse   . CVA (cerebral vascular accident)   . Psychosis    History reviewed. No pertinent past surgical history. No family history on file. History  Substance Use Topics  . Smoking status: Never Smoker   . Smokeless tobacco: Not on file  . Alcohol Use: Not on file    Review of Systems  Level V caveat- Uncooperative and dementia  Allergies  Fosinopril and Prazosin  Home Medications   Prior to Admission medications   Medication Sig Start Date End Date Taking? Authorizing Provider  albuterol (PROVENTIL) (2.5 MG/3ML) 0.083% nebulizer solution Take 2.5 mg by nebulization every 4 (four) hours as needed for shortness of breath.   Yes Historical Provider, MD  aspirin 81 MG chewable tablet 81 mg by PEG Tube route daily.   Yes Historical Provider, MD  chlorhexidine (PERIDEX) 0.12 % solution Use as directed 10 mLs in the mouth or throat 2 (two) times daily.   Yes Historical Provider, MD  divalproex (DEPAKOTE SPRINKLE) 125 MG capsule Take 250 mg by mouth 2 (two) times daily.   Yes Historical Provider, MD  folic acid (FOLVITE) 1 MG tablet 1 mg by PEG Tube route daily.   Yes Historical  Provider, MD  levETIRAcetam (KEPPRA) 100 MG/ML solution by PEG Tube route See admin instructions. Give 70ml (500mg ) via tube twice daily.   Yes Historical Provider, MD  LORazepam (ATIVAN) 2 MG/ML injection Inject 1 mg into the vein every 6 (six) hours as needed. Seizure/agitation.   Yes Historical Provider, MD  magnesium oxide (MAG-OX 400) 400 (241.3 MG) MG tablet 400 mg by PEG Tube route every 6 (six) hours.   Yes Historical Provider, MD  pantoprazole sodium (PROTONIX) 40 mg/20 mL PACK Place 40 mg into feeding tube daily.   Yes Historical Provider, MD  risperiDONE (RISPERDAL M-TABS) 2 MG disintegrating tablet 2 mg by PEG Tube route 2 (two) times daily.   Yes Historical Provider, MD  thiamine (VITAMIN B-1) 100 MG tablet 100 mg by PEG Tube route daily.   Yes Historical Provider, MD  levofloxacin (LEVAQUIN) 500 MG tablet Take 1 tablet (500 mg total) by mouth daily. 09/30/14   Rossetta Kama Marilu Favre, PA-C   BP 95/66  Pulse 94  Temp(Src) 98.9 F (37.2 C) (Oral)  Resp 11  SpO2 100% Physical Exam  Nursing note and vitals reviewed. Constitutional: He appears well-developed. No distress.  HENT:  Head: Normocephalic and atraumatic.  Eyes: Pupils are equal, round, and reactive to light.  Neck: Normal range of motion. Neck supple.  Cardiovascular: Normal rate and regular rhythm.   Pulmonary/Chest: Effort normal.  Coughing during exam  Abdominal: Soft.  Neurological: He is alert.  Unable to  assess neurological exam due to patient not being cooperative.  Skin: Skin is warm and dry.    ED Course  Procedures (including critical care time) Labs Review Labs Reviewed  CBC - Abnormal; Notable for the following:    RBC 3.58 (*)    Hemoglobin 11.3 (*)    HCT 32.2 (*)    All other components within normal limits  BASIC METABOLIC PANEL - Abnormal; Notable for the following:    Sodium 134 (*)    Chloride 94 (*)    Glucose, Bld 112 (*)    All other components within normal limits  URINALYSIS, ROUTINE W  REFLEX MICROSCOPIC - Abnormal; Notable for the following:    APPearance CLOUDY (*)    Hgb urine dipstick TRACE (*)    Leukocytes, UA LARGE (*)    All other components within normal limits  URINE MICROSCOPIC-ADD ON - Abnormal; Notable for the following:    Bacteria, UA FEW (*)    All other components within normal limits  URINE CULTURE  PROTIME-INR  APTT  CK TOTAL AND CKMB  URINE RAPID DRUG SCREEN (HOSP PERFORMED)  I-STAT TROPOININ, ED  I-STAT TROPOININ, ED    Imaging Review Ct Head Wo Contrast  09/30/2014   CLINICAL DATA:  Right arm weakness.  Patient unresponsive.  EXAM: CT HEAD WITHOUT CONTRAST  TECHNIQUE: Contiguous axial images were obtained from the base of the skull through the vertex without intravenous contrast.  COMPARISON:  Head CT scan 07/27/2011.  FINDINGS: Atrophy, chronic microvascular ischemic change and remote bilateral PCA territory infarcts, larger on the right are identified. There is also a remote left cerebellar infarct and right thalamic. No evidence of acute abnormality including infarction, hemorrhage, mass lesion, mass effect, midline shift or abnormal extra-axial fluid collection is identified. No hydrocephalus or pneumocephalus. The calvarium is intact.  IMPRESSION: No acute abnormality.  Atrophy, chronic microvascular ischemic change and remote infarcts as above.   Electronically Signed   By: Inge Rise M.D.   On: 09/30/2014 22:35   Dg Chest Port 1 View  09/30/2014   CLINICAL DATA:  Chest pain.  EXAM: PORTABLE CHEST - 1 VIEW  COMPARISON:  Single view of the chest and CT chest 09/21/2011.  FINDINGS: The lungs are clear. Heart size is normal. No pneumothorax or pleural effusion. No focal bony abnormality.  IMPRESSION: Negative chest.   Electronically Signed   By: Inge Rise M.D.   On: 09/30/2014 21:57     EKG Interpretation   Date/Time:  Wednesday September 30 2014 21:28:47 EDT Ventricular Rate:  100 PR Interval:    QRS Duration: 96 QT Interval:   354 QTC Calculation: 457 R Axis:   31 Text Interpretation:  Atrial flutter with predominant 3:1 AV block  Probable anterior infarct, old Sinus rhythm Artifact Abnormal ekg  Confirmed by Carmin Muskrat  MD [phone]) on 09/30/2014 9:42:18 PM      MDM   Final diagnoses:  UTI (lower urinary tract infection)    Dr. Vanita Panda saw patient as well. Unclear symptoms, therefore we will do TIA work up and r/o infection.  Patients vitals signs are stable in the ED. Pulse was 115 when the patient became agitated during rest his pulse was 70.  His urinalysis shows large Leukocytes and a few bacteria. 21-50 WBC on micro. Will treat with Levaquin, Dr. Vanita Panda is in agreement with this medication.   Normal PT/INR, APTT, CK total, i-stat trop is negative, CBC shows low hemoglobin at 11.3 which is his baseline.  Non acute head CT and chest xray. He is well enough to return to his facility.  68 y.o.Kolbe Mcghee's evaluation in the Emergency Department is complete. It has been determined that no acute conditions requiring further emergency intervention are present at this time. The patient/guardian have been advised of the diagnosis and plan. We have discussed signs and symptoms that warrant return to the ED, such as changes or worsening in symptoms.  Vital signs are stable at discharge. Filed Vitals:   09/30/14 2300  BP: 95/66  Pulse: 94  Temp:   Resp: 11    Patient/guardian has voiced understanding and agreed to follow-up with the PCP or specialist.     Linus Mako, PA-C 09/30/14 2337

## 2014-09-30 NOTE — ED Notes (Signed)
Patient transported to X-ray 

## 2014-09-30 NOTE — ED Provider Notes (Signed)
  This was a shared visit with a mid-level provided (NP or PA).  Throughout the patient's course I was available for consultation/collaboration.  I saw the ECG (if appropriate), relevant labs and studies - I agree with the interpretation.  On my exam the patient was in no distress.  He was incapable of providing aspects of HPI, with Hx of dementia. Neuro deficits c/w prior stroke, with no e/o new stroke. Patient diagnosed with UTI.      Medical screening examination/treatment/procedure(s) were performed by non-physician practitioner and as supervising physician I was immediately available for consultation/collaboration.   EKG Interpretation   Date/Time:  Wednesday September 30 2014 21:28:47 EDT Ventricular Rate:  100 PR Interval:    QRS Duration: 96 QT Interval:  354 QTC Calculation: 457 R Axis:   31 Text Interpretation:  Atrial flutter with predominant 3:1 AV block  Probable anterior infarct, old Sinus rhythm Artifact Abnormal ekg  Confirmed by Carmin Muskrat  MD [phone]) on 09/30/2014 9:42:18 PM         Carmin Muskrat, MD 09/30/14 2356

## 2014-10-03 LAB — URINE CULTURE: Colony Count: >=100000

## 2014-10-04 ENCOUNTER — Other Ambulatory Visit: Payer: Self-pay | Admitting: Internal Medicine

## 2014-10-04 ENCOUNTER — Telehealth (HOSPITAL_BASED_OUTPATIENT_CLINIC_OR_DEPARTMENT_OTHER): Payer: Self-pay | Admitting: Emergency Medicine

## 2014-10-04 MED ORDER — SULFAMETHOXAZOLE-TRIMETHOPRIM 200-40 MG/5ML PO SUSP: ORAL | Status: AC

## 2014-10-04 NOTE — Telephone Encounter (Signed)
Urine culture called to Lorraine @ [facility] and faxed [phone].

## 2014-10-07 NOTE — Progress Notes (Signed)
Patient ID: [name] [name], male   DOB: [birth year], 68 y.o.   MRN: [id number]           PROGRESS NOTE  DATE: 09/30/2014           FACILITY:  [facility]  LEVEL OF CARE: SNF (31)  Acute Visit  CHIEF COMPLAINT:  Manage weight loss.    HISTORY OF PRESENT ILLNESS: I was requested by the staff to assess the patient regarding above problem(s):  Staff report that patient has had significant weight loss.  On 09/17/2014:  Weight 125 pounds.  08/12/2014:  Weight 144.  07/07/2014:  Weight 141.   06/03/2014:  Weight 139.  04/29/2014:  Weight 139.  Due to advanced dementia, patient does not follow commands.  He is on tube feeds.    PAST MEDICAL HISTORY : Reviewed.  No changes/see problem list  CURRENT MEDICATIONS: Reviewed per MAR/see medication list  REVIEW OF SYSTEMS:  Unobtainable due to dementia.      PHYSICAL EXAMINATION  VS: see VS section  GENERAL: no acute distress, normal body habitus EYES: conjunctivae normal, sclerae normal, normal eye lids NECK: supple, trachea midline, no neck masses, no thyroid tenderness, no thyromegaly LYMPHATICS: no LAN in the neck, no supraclavicular LAN RESPIRATORY: breathing is even & unlabored, BS CTAB CARDIAC: RRR, no murmur,no extra heart sounds, no edema GI: abdomen soft, normal BS, no masses, no tenderness, no hepatomegaly, no splenomegaly, patient has a PEG     PSYCHIATRIC: the patient is alert, disoriented, affect & behavior appropriate  ASSESSMENT/PLAN:  Weight loss.  New onset.  Significant problem.  Check CBC, CMP, and TSH.  Agree with increasing tube feeds.    CPT CODE: 19417            Kasia Trego Y Bethaney Oshana, [place] [phone]

## 2014-10-12 ENCOUNTER — Non-Acute Institutional Stay (SKILLED_NURSING_FACILITY): Payer: PRIVATE HEALTH INSURANCE | Admitting: Internal Medicine

## 2014-10-12 DIAGNOSIS — R569 Unspecified convulsions: Secondary | ICD-10-CM

## 2014-10-12 DIAGNOSIS — I6990 Unspecified sequelae of unspecified cerebrovascular disease: Secondary | ICD-10-CM

## 2014-10-12 DIAGNOSIS — F1997 Other psychoactive substance use, unspecified with psychoactive substance-induced persisting dementia: Secondary | ICD-10-CM

## 2014-10-13 NOTE — Progress Notes (Signed)
              PROGRESS NOTE  DATE: 10-12-14  FACILITY: [facility]  LEVEL OF CARE: SNF  Routine Visit  CHIEF COMPLAINT:  Manage CVA, dementia and seizure disorder  HISTORY OF PRESENT ILLNESS:  REASSESSMENT OF ONGOING PROBLEM(S):  SEIZURE DISORDER: The patient's seizure disorder remains stable. No complications reported from the medications presently being used. Staff do not report any recent seizure activity.  DEMENTIA: The dementia remaines stable and continues to function adequately in the current living environment with supervision.  The patient has had little changes in behavior. No complications noted from the medications presently being used. Patient does not follow commands.  CVA: The patient's CVA remains stable.  Staff deny new neurologic symptoms such as numbness, tingling, weakness, speech difficulties or visual disturbances.  No complications reported from the medications currently being used.  PAST MEDICAL HISTORY : Reviewed.  No changes.  CURRENT MEDICATIONS: Reviewed per [facility]  REVIEW OF SYSTEMS: Unobtainable secondary to dementia.  PHYSICAL EXAMINATION  VS:  see vital signs section  GENERAL: no acute distress, thin body habitus EYES: conjunctivae normal, sclerae normal, normal eye lids NECK: supple, trachea midline, no thyroid tenderness, no thyromegaly LYMPHATICS: no LAN in the neck, no supraclavicular LAN RESPIRATORY: breathing is even & unlabored, BS CTAB CARDIAC: RRR, no murmur,no extra heart sounds, no edema GI: abdomen soft, normal BS, no masses, no tenderness, no hepatomegaly, no splenomegaly PSYCHIATRIC: the patient is alert & disoriented , affect & behavior appropriate.  Patient is somewhat agitated.  LABS/RADIOLOGY: 9-15 hemoglobin 11.7, MCV 94 otherwise CBC normal, CMP normal, FLP normal, hemoglobin A1c 5.6, folate greater than 19.9, magnesium level 1.6 6-15 Depakote level 16 3-15 hemoglobin 11.1, MCV 92 otherwise CBC normal, and 3.5 otherwise CMP  normal, HDL 37 otherwise fasting lipid panel normal, hemoglobin A1c 5.7, folate greater than 19.9, magnesium 1.8 2-15 hemoglobin 10.9, MCV 92 otherwise CBC normal, BMP normal 12-14 hemoglobin 10.7, MCV 94 otherwise CBC normal, glucose 119 otherwise BMP normal  11-14 hemoglobin 11.7, MCV 94 otherwise CBC normal  10-14 hemoglobin 12.4, MCV 94 otherwise CBC normal, glucose 123 otherwise CMP normal. Repeat CBC: Hemoglobin 11.2 MCV 94 otherwise CBC normal  9-14 hemoglobin 12.3, MCV 92 otherwise CBC normal, glucose 129, albumin 3.4, alkaline phosphatase 137 otherwise CMP normal, HDL 30 otherwise fasting lipid panel normal, hemoglobin A1c 6.1, folate greater than 19.9, magnesium 1.7 3/14 hemoglobin 11.5, MCV 95 otherwise CBC normal, alkaline phosphatase 2:30, glucose 105, AST 69, ALT 334, fasting lipid panel normal, hemoglobin A1c 5.9, folate greater than 19.9, magnesium 2 9/13 glucose 125, alkaline phosphatase 139 otherwise CMP normal  ASSESSMENT/PLAN:  CVA-continue supportive care. seizure disorder-well-controlled. Dementia 292.82-advanced. risperdal decreased Hypomagnesimia-well repleted. Continue supplementation. GERD-stable Depression-on Paxil Anemia of chronic disease-stable  CPT CODE: 30160  Edgar Frisk. Durwin Reges, [place] [phone]

## 2014-10-26 ENCOUNTER — Other Ambulatory Visit: Payer: Self-pay | Admitting: *Deleted

## 2014-10-26 MED ORDER — LORAZEPAM 1 MG PO TABS: ORAL_TABLET | ORAL | Status: AC

## 2014-10-26 NOTE — Telephone Encounter (Signed)
[name] 

## 2014-11-06 ENCOUNTER — Emergency Department (HOSPITAL_COMMUNITY): Payer: Non-veteran care

## 2014-11-06 ENCOUNTER — Encounter (HOSPITAL_COMMUNITY): Payer: Self-pay | Admitting: Emergency Medicine

## 2014-11-06 ENCOUNTER — Emergency Department (HOSPITAL_COMMUNITY)
Admission: EM | Admit: 2014-11-06 | Discharge: 2014-11-06 | Disposition: A | Discharge status: Home / Self Care | Payer: Non-veteran care | Attending: Emergency Medicine | Admitting: Emergency Medicine

## 2014-11-06 DIAGNOSIS — K9420 Gastrostomy complication, unspecified: Secondary | ICD-10-CM | POA: Insufficient documentation

## 2014-11-06 DIAGNOSIS — Z7982 Long term (current) use of aspirin: Secondary | ICD-10-CM | POA: Diagnosis not present

## 2014-11-06 DIAGNOSIS — Z79899 Other long term (current) drug therapy: Secondary | ICD-10-CM | POA: Diagnosis not present

## 2014-11-06 DIAGNOSIS — T85528A Displacement of other gastrointestinal prosthetic devices, implants and grafts, initial encounter: Secondary | ICD-10-CM

## 2014-11-06 DIAGNOSIS — K219 Gastro-esophageal reflux disease without esophagitis: Secondary | ICD-10-CM | POA: Diagnosis not present

## 2014-11-06 DIAGNOSIS — Z8673 Personal history of transient ischemic attack (TIA), and cerebral infarction without residual deficits: Secondary | ICD-10-CM | POA: Diagnosis not present

## 2014-11-06 DIAGNOSIS — F0390 Unspecified dementia without behavioral disturbance: Secondary | ICD-10-CM | POA: Diagnosis not present

## 2014-11-06 DIAGNOSIS — Y833 Surgical operation with formation of external stoma as the cause of abnormal reaction of the patient, or of later complication, without mention of misadventure at the time of the procedure: Secondary | ICD-10-CM | POA: Diagnosis not present

## 2014-11-06 HISTORY — DX: Unspecified dementia, unspecified severity, without behavioral disturbance, psychotic disturbance, mood disturbance, and anxiety: F03.90

## 2014-11-06 MED ORDER — IOHEXOL 300 MG/ML  SOLN
50.0000 mL | Freq: Once | INTRAMUSCULAR | Status: AC | PRN
  Administered 2014-11-06: 50 mL

## 2014-11-06 NOTE — ED Provider Notes (Signed)
CSN: [id number]     Arrival date & time 11/06/14  1744 History   First MD Initiated Contact with Patient 11/06/14 1813     Chief Complaint  Patient presents with  . G-Tube replacement      (Consider location/radiation/quality/duration/timing/severity/associated sxs/prior Treatment) HPI Comments: g-tube needs to be replaced as it has stopped working.  No other complaints per nursing home at this time.  The history is provided by the EMS personnel and the nursing home. The history is limited by the absence of a caregiver.    Past Medical History  Diagnosis Date  . GERD (gastroesophageal reflux disease)   . ETOH abuse   . CVA (cerebral vascular accident)   . Psychosis   . Dementia    History reviewed. No pertinent past surgical history. No family history on file. History  Substance Use Topics  . Smoking status: Never Smoker   . Smokeless tobacco: Not on file  . Alcohol Use: Not on file    Review of Systems  Unable to perform ROS     Allergies  Fosinopril and Prazosin  Home Medications   Prior to Admission medications   Medication Sig Start Date End Date Taking? Authorizing Provider  albuterol (PROVENTIL) (2.5 MG/3ML) 0.083% nebulizer solution Take 2.5 mg by nebulization every 4 (four) hours as needed for shortness of breath.    Historical Provider, MD  aspirin 81 MG chewable tablet 81 mg by PEG Tube route daily.    Historical Provider, MD  chlorhexidine (PERIDEX) 0.12 % solution Use as directed 10 mLs in the mouth or throat 2 (two) times daily.    Historical Provider, MD  divalproex (DEPAKOTE SPRINKLE) 125 MG capsule Take 250 mg by mouth 2 (two) times daily.    Historical Provider, MD  folic acid (FOLVITE) 1 MG tablet 1 mg by PEG Tube route daily.    Historical Provider, MD  levETIRAcetam (KEPPRA) 100 MG/ML solution by PEG Tube route See admin instructions. Give 43ml (500mg ) via tube twice daily.    Historical Provider, MD  LORazepam (ATIVAN) 1 MG tablet Administer one  tablet via tube every 6 hours as needed for anxiety 10/26/14   Tiffany L Reed, DO  LORazepam (ATIVAN) 2 MG/ML injection Inject 1 mg into the vein every 6 (six) hours as needed. Seizure/agitation.    Historical Provider, MD  magnesium oxide (MAG-OX 400) 400 (241.3 MG) MG tablet 400 mg by PEG Tube route every 6 (six) hours.    Historical Provider, MD  pantoprazole sodium (PROTONIX) 40 mg/20 mL PACK Place 40 mg into feeding tube daily.    Historical Provider, MD  risperiDONE (RISPERDAL M-TABS) 2 MG disintegrating tablet 2 mg by PEG Tube route 2 (two) times daily.    Historical Provider, MD  sulfamethoxazole-trimethoprim (BACTRIM,SEPTRA) 200-40 MG/5ML suspension Give10 cc every 12 hours for 10 days 10/04/14   Estill Dooms, MD  thiamine (VITAMIN B-1) 100 MG tablet 100 mg by PEG Tube route daily.    Historical Provider, MD   BP 117/69 mmHg  Pulse 79  Temp(Src) 97.8 F (36.6 C) (Axillary)  Resp 16  SpO2 98% Physical Exam  Constitutional: He appears well-developed and well-nourished. No distress.  HENT:  Head: Normocephalic and atraumatic.  Cardiovascular: Normal rate.   Pulmonary/Chest: Effort normal.  Abdominal: Soft. Bowel sounds are normal. He exhibits no distension. There is no tenderness.  Non-functioning g-tube in the LUQ  Neurological: He is alert.  Severe dementia and non-verbal  Skin: Skin is warm and dry. No  rash noted. No erythema.  Nursing note and vitals reviewed.   ED Course  Procedures (including critical care time) Labs Review Labs Reviewed - No data to display  Imaging Review Dg Abd 1 View  11/06/2014   CLINICAL DATA:  Nursing home patient pulled out gastrostomy tube  EXAM: ABDOMEN - 1 VIEW  COMPARISON:  06/29/2014  FINDINGS: Oral contrast injected through gastrostomy tube opacifies the gastric lumen with no evidence of leak into the peritoneal cavity.  IMPRESSION: Injection of oral contrast through gastrostomy tube demonstrates appropriate placement   Electronically  Signed   By: Skipper Cliche M.D.   On: 11/06/2014 19:43     EKG Interpretation None      MDM   Final diagnoses:  Jejunostomy tube fell out    Pt's tube fell out and not functioning.  Pt had new tube placed and confirmed by [facility]    Blanchie Dessert, MD 11/06/14 2007

## 2014-11-06 NOTE — ED Notes (Signed)
Bed: [facility] Expected date:  Expected time:  Means of arrival:  Comments: EMS-G-tube fell out

## 2014-11-06 NOTE — ED Notes (Signed)
Patient transported to X-ray 

## 2014-11-06 NOTE — ED Notes (Signed)
Attempted to call report to [facility]. No one answered. [facility] called to take pt back to [facility].

## 2014-11-06 NOTE — ED Notes (Signed)
Pt BIB EMS. Pt is from [facility]. Pt pulled out his G tube. Pt has no complaints. History of severe dementia.

## 2014-11-06 NOTE — ED Notes (Signed)
MD at bedside. Dr. Maryan Rued at bedside replacing G-tube. Pt tolerating well.

## 2014-11-09 ENCOUNTER — Encounter (HOSPITAL_COMMUNITY): Payer: Self-pay | Admitting: Emergency Medicine

## 2014-11-09 ENCOUNTER — Emergency Department (HOSPITAL_COMMUNITY): Payer: Non-veteran care

## 2014-11-09 ENCOUNTER — Emergency Department (HOSPITAL_COMMUNITY)
Admission: EM | Admit: 2014-11-09 | Discharge: 2014-11-09 | Disposition: A | Discharge status: Home / Self Care | Payer: Non-veteran care | Attending: Emergency Medicine | Admitting: Emergency Medicine

## 2014-11-09 DIAGNOSIS — K9423 Gastrostomy malfunction: Secondary | ICD-10-CM | POA: Insufficient documentation

## 2014-11-09 DIAGNOSIS — Z79899 Other long term (current) drug therapy: Secondary | ICD-10-CM | POA: Diagnosis not present

## 2014-11-09 DIAGNOSIS — T85528A Displacement of other gastrointestinal prosthetic devices, implants and grafts, initial encounter: Secondary | ICD-10-CM

## 2014-11-09 DIAGNOSIS — F29 Unspecified psychosis not due to a substance or known physiological condition: Secondary | ICD-10-CM | POA: Insufficient documentation

## 2014-11-09 DIAGNOSIS — K219 Gastro-esophageal reflux disease without esophagitis: Secondary | ICD-10-CM | POA: Insufficient documentation

## 2014-11-09 DIAGNOSIS — F0390 Unspecified dementia without behavioral disturbance: Secondary | ICD-10-CM | POA: Insufficient documentation

## 2014-11-09 DIAGNOSIS — Z934 Other artificial openings of gastrointestinal tract status: Secondary | ICD-10-CM

## 2014-11-09 DIAGNOSIS — Z8673 Personal history of transient ischemic attack (TIA), and cerebral infarction without residual deficits: Secondary | ICD-10-CM | POA: Diagnosis not present

## 2014-11-09 DIAGNOSIS — R05 Cough: Secondary | ICD-10-CM | POA: Insufficient documentation

## 2014-11-09 DIAGNOSIS — Z434 Encounter for attention to other artificial openings of digestive tract: Secondary | ICD-10-CM

## 2014-11-09 DIAGNOSIS — R059 Cough, unspecified: Secondary | ICD-10-CM

## 2014-11-09 DIAGNOSIS — Z7982 Long term (current) use of aspirin: Secondary | ICD-10-CM | POA: Insufficient documentation

## 2014-11-09 NOTE — ED Notes (Signed)
Per EMS: Pt from [facility]. Staff states pt may have pulled out g-tube and placement needs to be checked. No drainage noted around the site. Pt does not appear to be in any pain or discomfort.

## 2014-11-09 NOTE — ED Notes (Signed)
Ptar here to tak patient back to [facility] facility.. Pt vss. NAD on discharge. No IV's, no oxygen

## 2014-11-09 NOTE — ED Provider Notes (Signed)
CSN: [id number]     Arrival date & time 11/09/14  0219 History   First MD Initiated Contact with Patient 11/09/14 0230     Chief Complaint  Patient presents with  . g-tube placement needs checked      (Consider location/radiation/quality/duration/timing/severity/associated sxs/prior Treatment) HPI Comments: The patient arrives via EMS with report from NH that g-tube was pulled out. No complaint of pain, fever, vomiting.  The history is provided by the patient. No language interpreter was used.    Past Medical History  Diagnosis Date  . GERD (gastroesophageal reflux disease)   . ETOH abuse   . CVA (cerebral vascular accident)   . Psychosis   . Dementia    History reviewed. No pertinent past surgical history. No family history on file. History  Substance Use Topics  . Smoking status: Never Smoker   . Smokeless tobacco: Not on file  . Alcohol Use: Not on file    Review of Systems  Unable to perform ROS: Dementia      Allergies  Fosinopril and Prazosin  Home Medications   Prior to Admission medications   Medication Sig Start Date End Date Taking? Authorizing Provider  albuterol (PROVENTIL) (2.5 MG/3ML) 0.083% nebulizer solution Take 2.5 mg by nebulization every 4 (four) hours as needed for shortness of breath.   Yes Historical Provider, MD  aspirin 81 MG chewable tablet 81 mg by PEG Tube route daily.   Yes Historical Provider, MD  chlorhexidine (PERIDEX) 0.12 % solution Use as directed 10 mLs in the mouth or throat 2 (two) times daily.   Yes Historical Provider, MD  divalproex (DEPAKOTE SPRINKLE) 125 MG capsule Take 250 mg by mouth 2 (two) times daily.   Yes Historical Provider, MD  folic acid (FOLVITE) 1 MG tablet 1 mg by PEG Tube route daily.   Yes Historical Provider, MD  levETIRAcetam (KEPPRA) 100 MG/ML solution by PEG Tube route See admin instructions. Give 80ml (500mg ) via tube twice daily.   Yes Historical Provider, MD  magnesium oxide (MAG-OX 400) 400 (241.3 MG)  MG tablet 400 mg by PEG Tube route every 6 (six) hours.   Yes Historical Provider, MD  Multiple Vitamins-Minerals (CERTAVITE/ANTIOXIDANTS) LIQD Take 5 mLs by mouth every morning.   Yes Historical Provider, MD  Nutritional Supplements (ISOSOURCE HN PO) Take 1 each by mouth 4 (four) times daily.   Yes Historical Provider, MD  pantoprazole sodium (PROTONIX) 40 mg/20 mL PACK Place 40 mg into feeding tube daily.   Yes Historical Provider, MD  risperiDONE (RISPERDAL M-TABS) 2 MG disintegrating tablet 2 mg by PEG Tube route 2 (two) times daily.   Yes Historical Provider, MD  risperiDONE (RISPERDAL) 1 MG tablet Take 1 mg by mouth 2 (two) times daily.   Yes Historical Provider, MD  thiamine (VITAMIN B-1) 100 MG tablet 100 mg by PEG Tube route daily.   Yes Historical Provider, MD  LORazepam (ATIVAN) 1 MG tablet Administer one tablet via tube every 6 hours as needed for anxiety 10/26/14   Tiffany L Reed, DO  LORazepam (ATIVAN) 2 MG/ML injection Inject 1 mg into the vein every 6 (six) hours as needed. Seizure/agitation.    Historical Provider, MD  Nutritional Supplements (ISOSOURCE HN PO) Take 2 each by mouth 2 (two) times daily.    Historical Provider, MD  sulfamethoxazole-trimethoprim (BACTRIM,SEPTRA) 200-40 MG/5ML suspension Give10 cc every 12 hours for 10 days 10/04/14   Estill Dooms, MD   BP 123/66 mmHg  Pulse 75  Temp(Src) 98.4 F (  36.9 C) (Oral)  Resp 20  SpO2 98% Physical Exam  Constitutional: He appears well-developed and well-nourished. No distress.  HENT:  Head: Normocephalic.  Neck: Normal range of motion.  Pulmonary/Chest: Effort normal.  Abdominal: Soft.  Gastric tube in place, site unremarkable. Non-tender. Tubing is snug against abdominal wall and there is reliable resistance with gentle pulling.  Neurological: He is alert.    ED Course  Procedures (including critical care time) Labs Review Labs Reviewed - No data to display  Imaging Review Dg Chest 2 View  11/09/2014    CLINICAL DATA:  Cough. Patient may have pulled out G tube and placement needs to be checked.  EXAM: CHEST  2 VIEW  COMPARISON:  09/30/2014  FINDINGS: The heart size and mediastinal contours are within normal limits. Both lungs are clear. The visualized skeletal structures are unremarkable. Gastrostomy tube is not included within the field of view on the AP view.  IMPRESSION: No active cardiopulmonary disease.   Electronically Signed   By: Lucienne Capers M.D.   On: 11/09/2014 02:56     EKG Interpretation None      MDM   Final diagnoses:  Cough  Gastrojejunostomy tube dislodgement    1. G-tube evaluation, normal  Tubing is patent when irrigated. The patient is in no distress. Abdomen is soft and there is no apparent tenderness. The patient has a cough but has a clear chest x-ray and all vital signs normal. He is stable to be discharged back to the nursing home.    Dewaine Oats, PA-C 11/09/14 [place], MD 11/09/14 [phone]

## 2014-11-09 NOTE — Discharge Instructions (Signed)
G-tube appears to be in place and functioning. Site unremarkable. The patient should be monitored by his doctor for appropriate rechecks.

## 2014-12-27 IMAGING — CR DG CHEST 1V PORT
1 series · 1 of 1 positions shown · non-contrast
Comparison: Single view of the chest and CT chest 09/21/2011.

CLINICAL DATA: Chest pain.

EXAM:
PORTABLE CHEST - 1 VIEW

[AP]
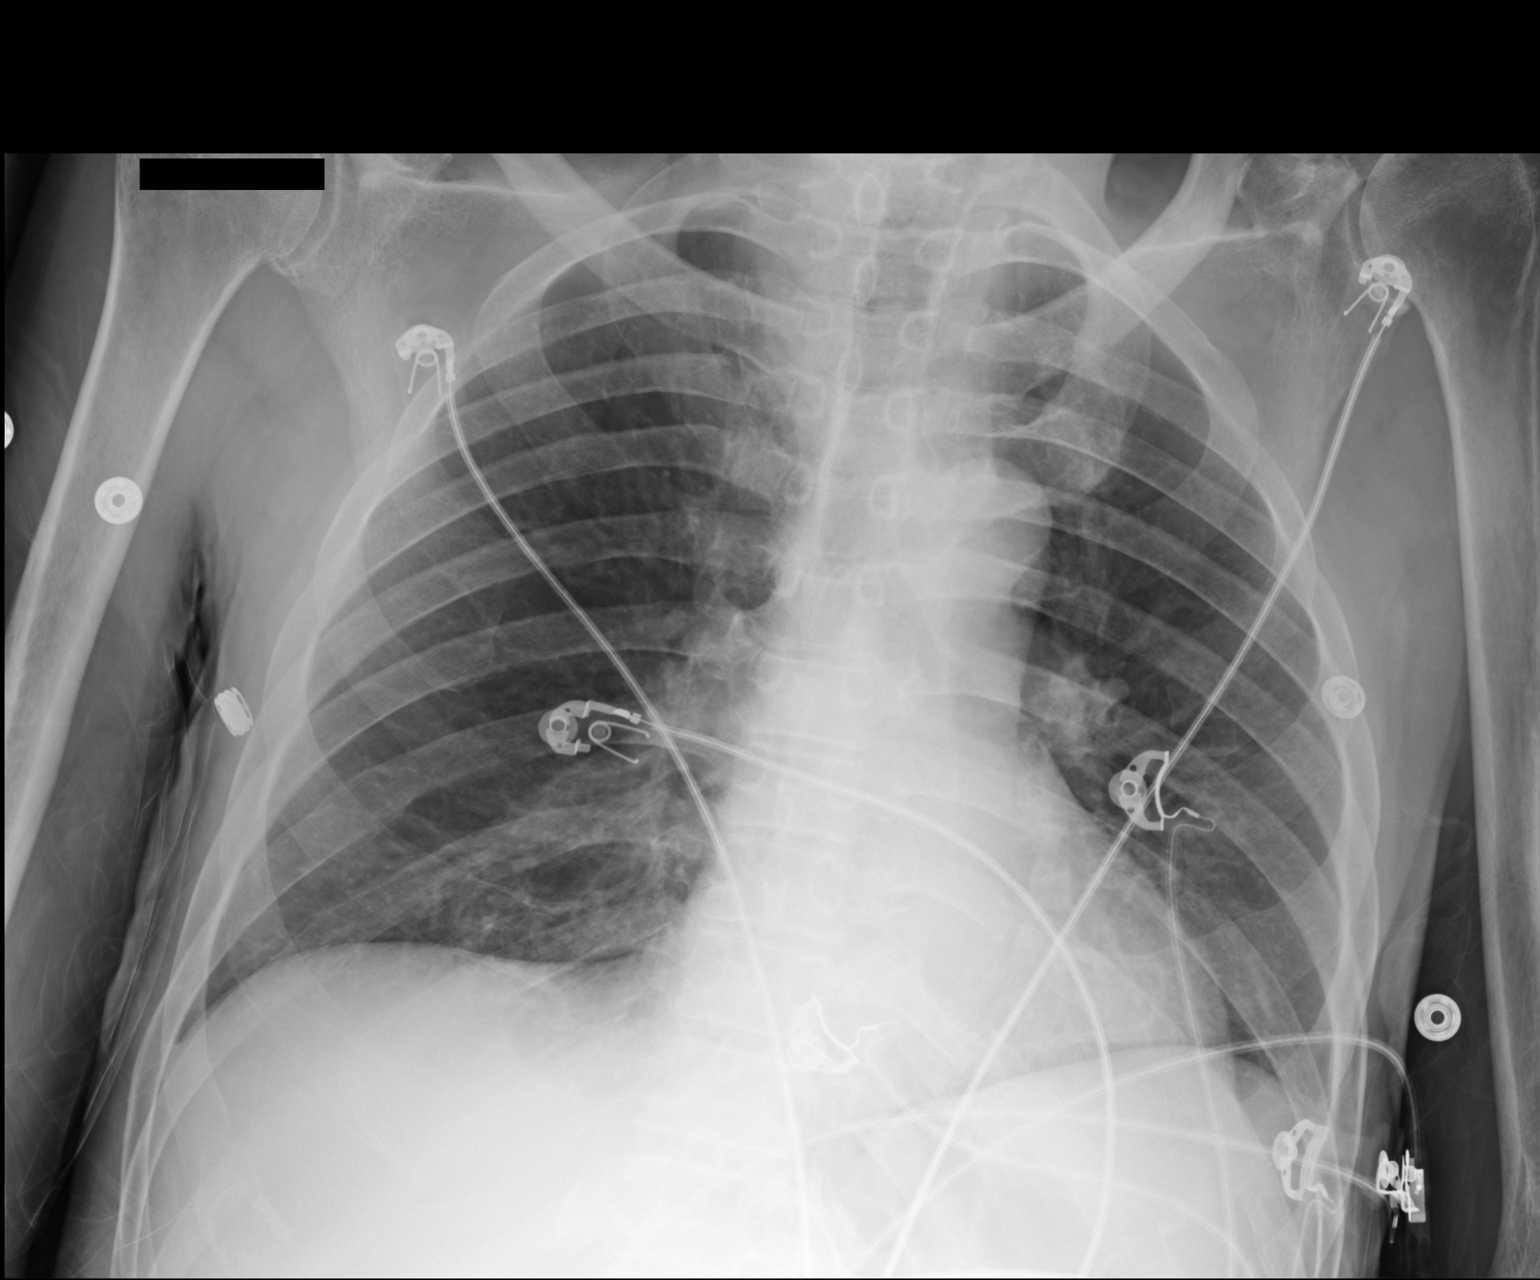

[1 of 1 positions shown; findings below may reference images not displayed]

FINDINGS: The lungs are clear. Heart size is normal. No pneumothorax or
pleural effusion. No focal bony abnormality.
IMPRESSION: Negative chest.

## 2014-12-31 ENCOUNTER — Other Ambulatory Visit (HOSPITAL_COMMUNITY): Payer: Self-pay | Admitting: Internal Medicine

## 2014-12-31 ENCOUNTER — Ambulatory Visit (HOSPITAL_COMMUNITY)
Admission: RE | Admit: 2014-12-31 | Discharge: 2014-12-31 | Disposition: A | Discharge status: Home / Self Care | Payer: Non-veteran care | Source: Ambulatory Visit | Attending: Internal Medicine | Admitting: Internal Medicine

## 2014-12-31 DIAGNOSIS — Z931 Gastrostomy status: Secondary | ICD-10-CM | POA: Insufficient documentation

## 2014-12-31 DIAGNOSIS — R1310 Dysphagia, unspecified: Secondary | ICD-10-CM | POA: Diagnosis present

## 2014-12-31 MED ORDER — LIDOCAINE VISCOUS 2 % MT SOLN
OROMUCOSAL | Status: AC
  Filled 2014-12-31: qty 15

## 2014-12-31 MED ORDER — IOHEXOL 300 MG/ML  SOLN
50.0000 mL | Freq: Once | INTRAMUSCULAR | Status: AC | PRN
  Administered 2014-12-31: 10 mL via INTRAVENOUS

## 2015-02-02 IMAGING — CR DG ABDOMEN 1V
1 series · 1 of 1 positions shown · non-contrast
Comparison: 06/29/2014

CLINICAL DATA: [HOSPITAL] patient pulled out gastrostomy tube

EXAM:
ABDOMEN - 1 VIEW

[x abdomen supine]
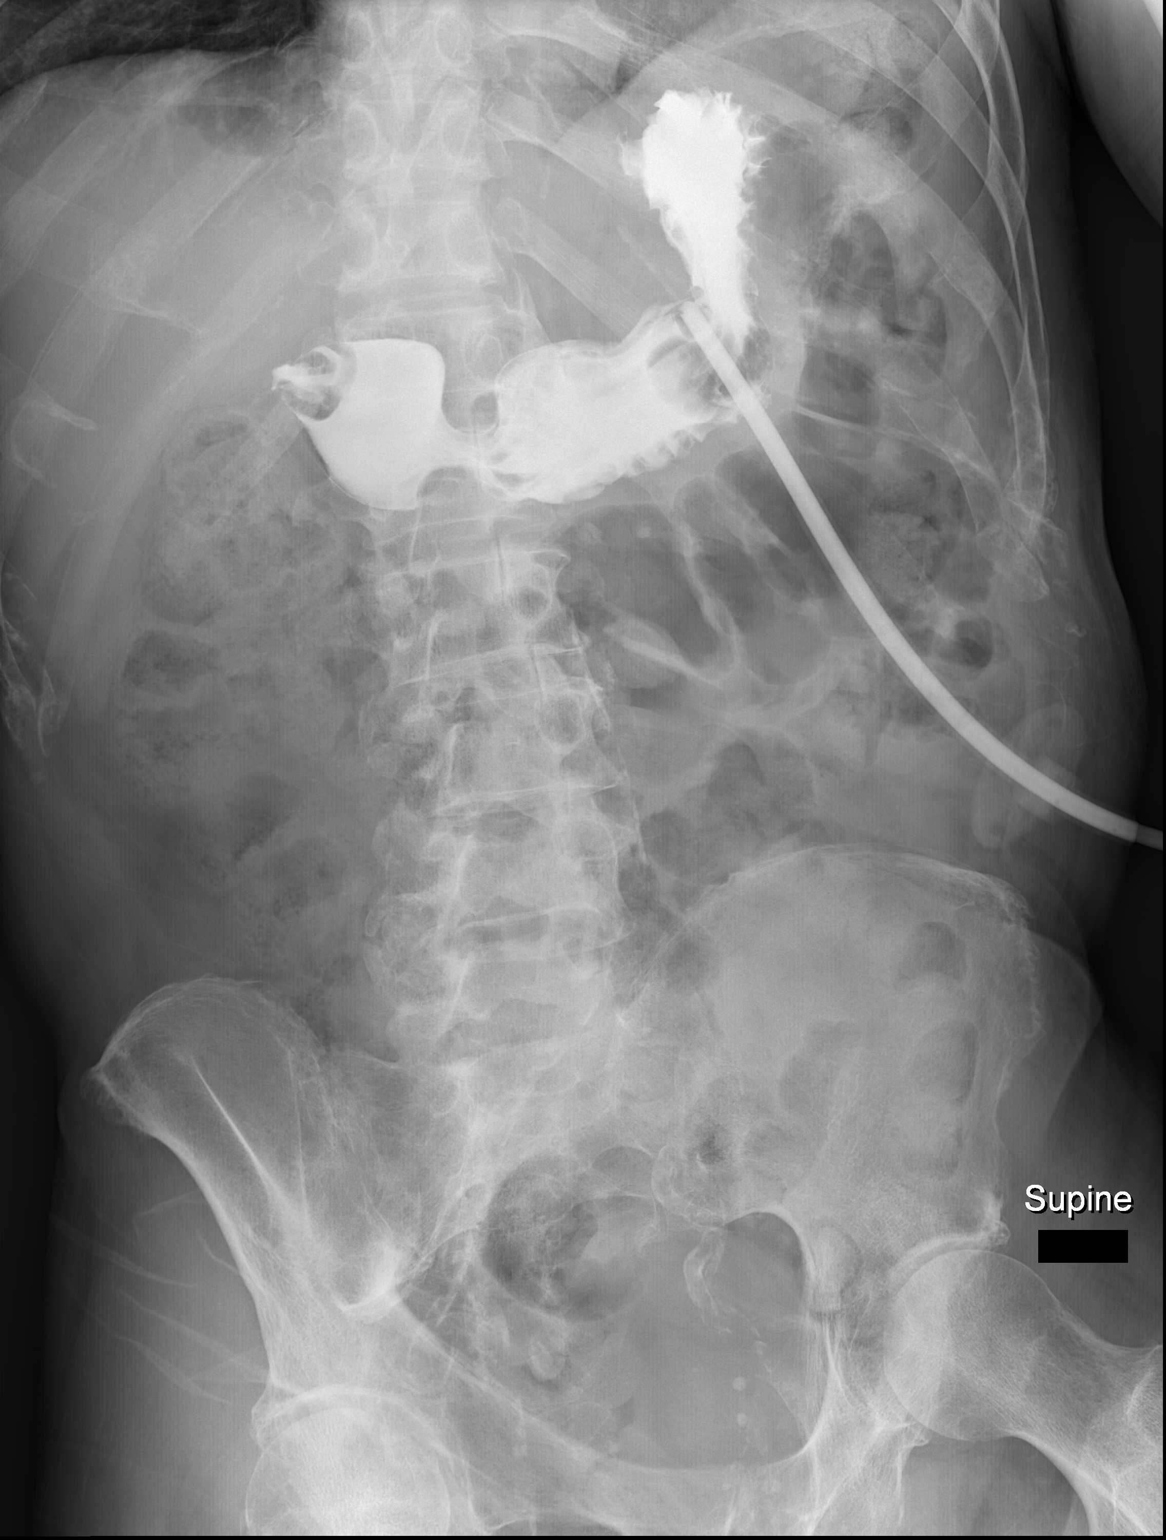

[1 of 1 positions shown; findings below may reference images not displayed]

FINDINGS: Oral contrast injected through gastrostomy tube opacifies the
gastric lumen with no evidence of leak into the peritoneal cavity.
IMPRESSION: Injection of oral contrast through gastrostomy tube demonstrates
appropriate placement

## 2015-06-27 ENCOUNTER — Emergency Department (HOSPITAL_COMMUNITY)
Admission: EM | Admit: 2015-06-27 | Discharge: 2015-06-27 | Disposition: A | Discharge status: Home / Self Care | Payer: Non-veteran care | Attending: Emergency Medicine | Admitting: Emergency Medicine

## 2015-06-27 ENCOUNTER — Encounter (HOSPITAL_COMMUNITY): Payer: Self-pay | Admitting: Emergency Medicine

## 2015-06-27 ENCOUNTER — Emergency Department (HOSPITAL_COMMUNITY): Payer: Non-veteran care

## 2015-06-27 DIAGNOSIS — T85528A Displacement of other gastrointestinal prosthetic devices, implants and grafts, initial encounter: Secondary | ICD-10-CM

## 2015-06-27 DIAGNOSIS — Y833 Surgical operation with formation of external stoma as the cause of abnormal reaction of the patient, or of later complication, without mention of misadventure at the time of the procedure: Secondary | ICD-10-CM | POA: Diagnosis not present

## 2015-06-27 DIAGNOSIS — Z7982 Long term (current) use of aspirin: Secondary | ICD-10-CM | POA: Diagnosis not present

## 2015-06-27 DIAGNOSIS — F0390 Unspecified dementia without behavioral disturbance: Secondary | ICD-10-CM | POA: Insufficient documentation

## 2015-06-27 DIAGNOSIS — Z8673 Personal history of transient ischemic attack (TIA), and cerebral infarction without residual deficits: Secondary | ICD-10-CM | POA: Insufficient documentation

## 2015-06-27 DIAGNOSIS — Z431 Encounter for attention to gastrostomy: Secondary | ICD-10-CM

## 2015-06-27 DIAGNOSIS — Z79899 Other long term (current) drug therapy: Secondary | ICD-10-CM | POA: Diagnosis not present

## 2015-06-27 DIAGNOSIS — K219 Gastro-esophageal reflux disease without esophagitis: Secondary | ICD-10-CM | POA: Insufficient documentation

## 2015-06-27 DIAGNOSIS — K9429 Other complications of gastrostomy: Secondary | ICD-10-CM | POA: Insufficient documentation

## 2015-06-27 MED ORDER — IOHEXOL 300 MG/ML  SOLN
50.0000 mL | Freq: Once | INTRAMUSCULAR | Status: AC | PRN
Start: 2015-06-27 — End: 2015-06-27
  Administered 2015-06-27: 40 mL

## 2015-06-27 NOTE — ED Notes (Signed)
[place] staff was called and was given report and discharge instructions on pt.

## 2015-06-27 NOTE — ED Notes (Signed)
Bed: KB52 Expected date:  Expected time:  Means of arrival:  Comments: 69 yo M  Pulled out peg tube

## 2015-06-27 NOTE — Discharge Instructions (Signed)
A gastrostomy tube could not be placed in the ED because the patient's stoma (opening into the stomach) has become stenotic (narrowed). A Foley catheter was placed as a temporary means for providing liquids and medications but his primary care physician will need to arrange for formal gastrostomy tube replacement.

## 2015-06-27 NOTE — ED Notes (Signed)
[facility] was called for pt's transport back to [facility].

## 2015-06-27 NOTE — ED Notes (Signed)
[facility] here to transport pt back to [facility]. 

## 2015-06-27 NOTE — ED Provider Notes (Signed)
CSN: [id number]     Arrival date & time 06/27/15  0034 History   First MD Initiated Contact with Patient 06/27/15 0046     Chief Complaint  Patient presents with  . Peg Tube Dislodged      (Consider location/radiation/quality/duration/timing/severity/associated sxs/prior Treatment) HPI  This is a 69 year old male with severe dementia who is a resident in a nursing home. He has a long-standing history of PEG tube. He was sent from his facility when staff realized his PEG tube had been pulled out and was lying next to him on his bed. It is unknown how long he did been out. It is unknown what size it was in the tube was not sent with the patient. There is no bleeding or drainage at the PEG tube site.  Past Medical History  Diagnosis Date  . GERD (gastroesophageal reflux disease)   . ETOH abuse   . CVA (cerebral vascular accident)   . Psychosis   . Dementia    History reviewed. No pertinent past surgical history. History reviewed. No pertinent family history. History  Substance Use Topics  . Smoking status: Never Smoker   . Smokeless tobacco: Not on file  . Alcohol Use: Not on file    Review of Systems  Unable to perform ROS   Allergies  Fosinopril and Prazosin  Home Medications   Prior to Admission medications   Medication Sig Start Date End Date Taking? Authorizing Provider  acetaminophen (TYLENOL) 500 MG tablet Take 500 mg by mouth 3 (three) times daily.   Yes Historical Provider, MD  aspirin 81 MG chewable tablet 81 mg by PEG Tube route daily.   Yes Historical Provider, MD  baclofen (LIORESAL) 10 MG tablet Take 10 mg by mouth 3 (three) times daily.   Yes Historical Provider, MD  chlorhexidine (PERIDEX) 0.12 % solution Use as directed 10 mLs in the mouth or throat 2 (two) times daily.   Yes Historical Provider, MD  divalproex (DEPAKOTE SPRINKLE) 125 MG capsule Take 500 mg by mouth 2 (two) times daily.    Yes Historical Provider, MD  folic acid (FOLVITE) 1 MG tablet 1 mg by  PEG Tube route daily.   Yes Historical Provider, MD  levETIRAcetam (KEPPRA) 100 MG/ML solution 500 mg by PEG Tube route 2 (two) times daily. Give 85ml (500mg ) via tube twice daily.   Yes Historical Provider, MD  LORazepam (ATIVAN) 1 MG tablet Administer one tablet via tube every 6 hours as needed for anxiety Patient taking differently: Take 1 mg by mouth every 6 (six) hours as needed for anxiety. Administer one tablet via tube every 6 hours as needed for anxiety 10/26/14  Yes Tiffany L Reed, DO  magnesium oxide (MAG-OX 400) 400 (241.3 MG) MG tablet 400 mg by PEG Tube route every 6 (six) hours.   Yes Historical Provider, MD  Multiple Vitamins-Minerals (CERTAVITE/ANTIOXIDANTS) LIQD Take 5 mLs by mouth every morning.   Yes Historical Provider, MD  Nutritional Supplements (ISOSOURCE HN PO) Take 1 Can by mouth 4 (four) times daily.    Yes Historical Provider, MD  Nutritional Supplements (ISOSOURCE HN PO) Take 2 Cans by mouth 2 (two) times daily.    Yes Historical Provider, MD  pantoprazole sodium (PROTONIX) 40 mg/20 mL PACK Place 40 mg into feeding tube daily.   Yes Historical Provider, MD  risperiDONE (RISPERDAL M-TABS) 2 MG disintegrating tablet 2 mg by PEG Tube route 2 (two) times daily.   Yes Historical Provider, MD  risperiDONE (RISPERDAL) 1 MG tablet  Take 1 mg by mouth 2 (two) times daily.   Yes Historical Provider, MD  thiamine (VITAMIN B-1) 100 MG tablet 100 mg by PEG Tube route daily.   Yes Historical Provider, MD  albuterol (PROVENTIL) (2.5 MG/3ML) 0.083% nebulizer solution Take 2.5 mg by nebulization every 4 (four) hours as needed for shortness of breath.    Historical Provider, MD  sulfamethoxazole-trimethoprim (BACTRIM,SEPTRA) 200-40 MG/5ML suspension Give10 cc every 12 hours for 10 days Patient not taking: Reported on 06/27/2015 10/04/14   Estill Dooms, MD   BP 118/77 mmHg  Pulse 93  Resp 18  SpO2 100%   Physical Exam  General: Well-developed, cachectic male in no acute distress; appears  older than age of record HENT: normocephalic; atraumatic Eyes: pupils equal, round and reactive to light; arcus senilis bilaterally Neck: supple Heart: regular rate and rhythm Lungs: clear to auscultation bilaterally Abdomen: soft; nondistended; gastrostomy and left upper quadrant; no masses or hepatosplenomegaly; bowel sounds present Extremities: Contractures of left upper extremity Neurologic: Somnolent but arousable; noted to move right upper extremity and bilateral lower extremities to tactile stimuli  Skin: Warm and dry    ED Course  Procedures (including critical care time)  PEG TUBE REPLACEMENT The patient's gastrostomy site was prepped with chlorhexidine and Surgilube. An attempt was made to place a 22 [place] gastrostomy tube into the gastrostomy site but this could not be done due to stenosis. A 12 French Foley catheter was placed into the gastrostomy site and the balloon inflated with 10 milliliters of sterile saline. The patient tolerated this well and there were no immediate complications.  MDM  Nursing notes and vitals signs, including pulse oximetry, reviewed.  Summary of this visit's results, reviewed by myself:  Imaging Studies: Dg Abd Portable 1v  06/27/2015   CLINICAL DATA:  Percutaneous gastrostomy tube dislodgement.  EXAM: PORTABLE ABDOMEN - 1 VIEW  COMPARISON:  None.  FINDINGS: 40 mL of contrast was injected into the percutaneous gastrostomy site through a Foley catheter. The contrast opacifies the gastric lumen. There is no leakage of contrast outside the stomach.  IMPRESSION: The injected contrast flows into the gastric lumen.   Electronically Signed   By: Andreas Newport M.D.   On: 06/27/2015 02:23       Shanon Rosser, MD 06/27/15 [phone]

## 2015-06-27 NOTE — ED Notes (Signed)
Brought in by EMS from [facility] facility with c/o peg tube dislodgement.  Per staff at the facility, pt's peg tube was found lying on his bed.  Pt on chronic peg tube feeding.

## 2015-06-29 ENCOUNTER — Other Ambulatory Visit (HOSPITAL_COMMUNITY): Payer: Self-pay | Admitting: Interventional Radiology

## 2015-06-29 ENCOUNTER — Ambulatory Visit (HOSPITAL_COMMUNITY)
Admission: RE | Admit: 2015-06-29 | Discharge: 2015-06-29 | Disposition: A | Discharge status: Home / Self Care | Payer: Non-veteran care | Source: Ambulatory Visit | Attending: Interventional Radiology | Admitting: Interventional Radiology

## 2015-06-29 DIAGNOSIS — R1310 Dysphagia, unspecified: Secondary | ICD-10-CM

## 2015-06-29 DIAGNOSIS — Z431 Encounter for attention to gastrostomy: Secondary | ICD-10-CM | POA: Insufficient documentation

## 2015-06-29 DIAGNOSIS — R1314 Dysphagia, pharyngoesophageal phase: Secondary | ICD-10-CM | POA: Diagnosis not present

## 2015-06-29 MED ORDER — IOHEXOL 300 MG/ML  SOLN
50.0000 mL | Freq: Once | INTRAMUSCULAR | Status: AC | PRN
  Administered 2015-06-29: 10 mL

## 2015-06-29 NOTE — Procedures (Signed)
Successful fluoroscopic guided replacement of a new 18-French gastrostomy tube. The new gastrostomy tube is ready for immediate use.

## 2015-09-15 ENCOUNTER — Other Ambulatory Visit (HOSPITAL_COMMUNITY): Payer: Self-pay | Admitting: Internal Medicine

## 2015-09-15 ENCOUNTER — Ambulatory Visit (HOSPITAL_COMMUNITY)
Admission: RE | Admit: 2015-09-15 | Discharge: 2015-09-15 | Disposition: A | Discharge status: Home / Self Care | Payer: Non-veteran care | Source: Ambulatory Visit | Attending: Internal Medicine | Admitting: Internal Medicine

## 2015-09-15 DIAGNOSIS — Z431 Encounter for attention to gastrostomy: Secondary | ICD-10-CM | POA: Diagnosis present

## 2015-09-15 DIAGNOSIS — R633 Feeding difficulties, unspecified: Secondary | ICD-10-CM

## 2015-09-15 DIAGNOSIS — R1310 Dysphagia, unspecified: Secondary | ICD-10-CM | POA: Insufficient documentation

## 2015-09-15 DIAGNOSIS — Z8673 Personal history of transient ischemic attack (TIA), and cerebral infarction without residual deficits: Secondary | ICD-10-CM | POA: Diagnosis not present

## 2015-09-15 MED ORDER — IOHEXOL 300 MG/ML  SOLN
20.0000 mL | Freq: Once | INTRAMUSCULAR | Status: AC | PRN
Start: 2015-09-15 — End: 2015-09-15
  Administered 2015-09-15: 7 mL

## 2015-11-10 ENCOUNTER — Other Ambulatory Visit (HOSPITAL_COMMUNITY): Payer: Self-pay | Admitting: Diagnostic Radiology

## 2015-11-10 ENCOUNTER — Ambulatory Visit (HOSPITAL_COMMUNITY)
Admission: RE | Admit: 2015-11-10 | Discharge: 2015-11-10 | Disposition: A | Discharge status: Home / Self Care | Payer: Non-veteran care | Source: Ambulatory Visit | Attending: Diagnostic Radiology | Admitting: Diagnostic Radiology

## 2015-11-10 DIAGNOSIS — Z431 Encounter for attention to gastrostomy: Secondary | ICD-10-CM | POA: Insufficient documentation

## 2015-11-10 DIAGNOSIS — R1310 Dysphagia, unspecified: Secondary | ICD-10-CM

## 2015-11-10 MED ORDER — IOHEXOL 300 MG/ML  SOLN
50.0000 mL | Freq: Once | INTRAMUSCULAR | Status: DC | PRN
  Administered 2015-11-10: 10 mL
  Filled 2015-11-10: qty 50

## 2017-02-22 DEATH — deceased
